# Patient Record
Sex: Male | Born: 1950 | Race: White | Hispanic: No | Marital: Married | Smoking: Former smoker
Health system: Southern US, Community
[De-identification: ages and names within clinical notes are randomized; demographics above are authoritative.]

## PROBLEM LIST (undated history)

## (undated) DIAGNOSIS — L709 Acne, unspecified: Secondary | ICD-10-CM

## (undated) DIAGNOSIS — C4491 Basal cell carcinoma of skin, unspecified: Secondary | ICD-10-CM

## (undated) HISTORY — DX: Acne, unspecified: L70.9

## (undated) HISTORY — PX: TONSILLECTOMY: SUR1361

## (undated) HISTORY — DX: Basal cell carcinoma of skin, unspecified: C44.91

---

## 1978-11-21 HISTORY — PX: VASECTOMY: SHX75

## 2010-07-22 ENCOUNTER — Ambulatory Visit: Payer: Self-pay | Admitting: General Practice

## 2011-07-21 IMAGING — CR LEFT WRIST - 2 VIEW
1 series · 2 of 2 positions shown · non-contrast
Comparison: none

REASON FOR EXAM: Eval for metallic foreign body (shrapnel) for MRI
clearance
COMMENTS:

PROCEDURE:     DXR - DXR WRIST LEFT AP AND LATERAL  - July 22, 2010  [DATE]
RESULT:     Two-view left wrist demonstrates no evidence of metallic foreign
bodies within the soft tissues which would preclude MRI evaluation. The
osseous structures are otherwise grossly unremarkable.

[Series 1: view not recorded · 0.17mm/px · 2 of 2 slices shown]
[im 1/2]
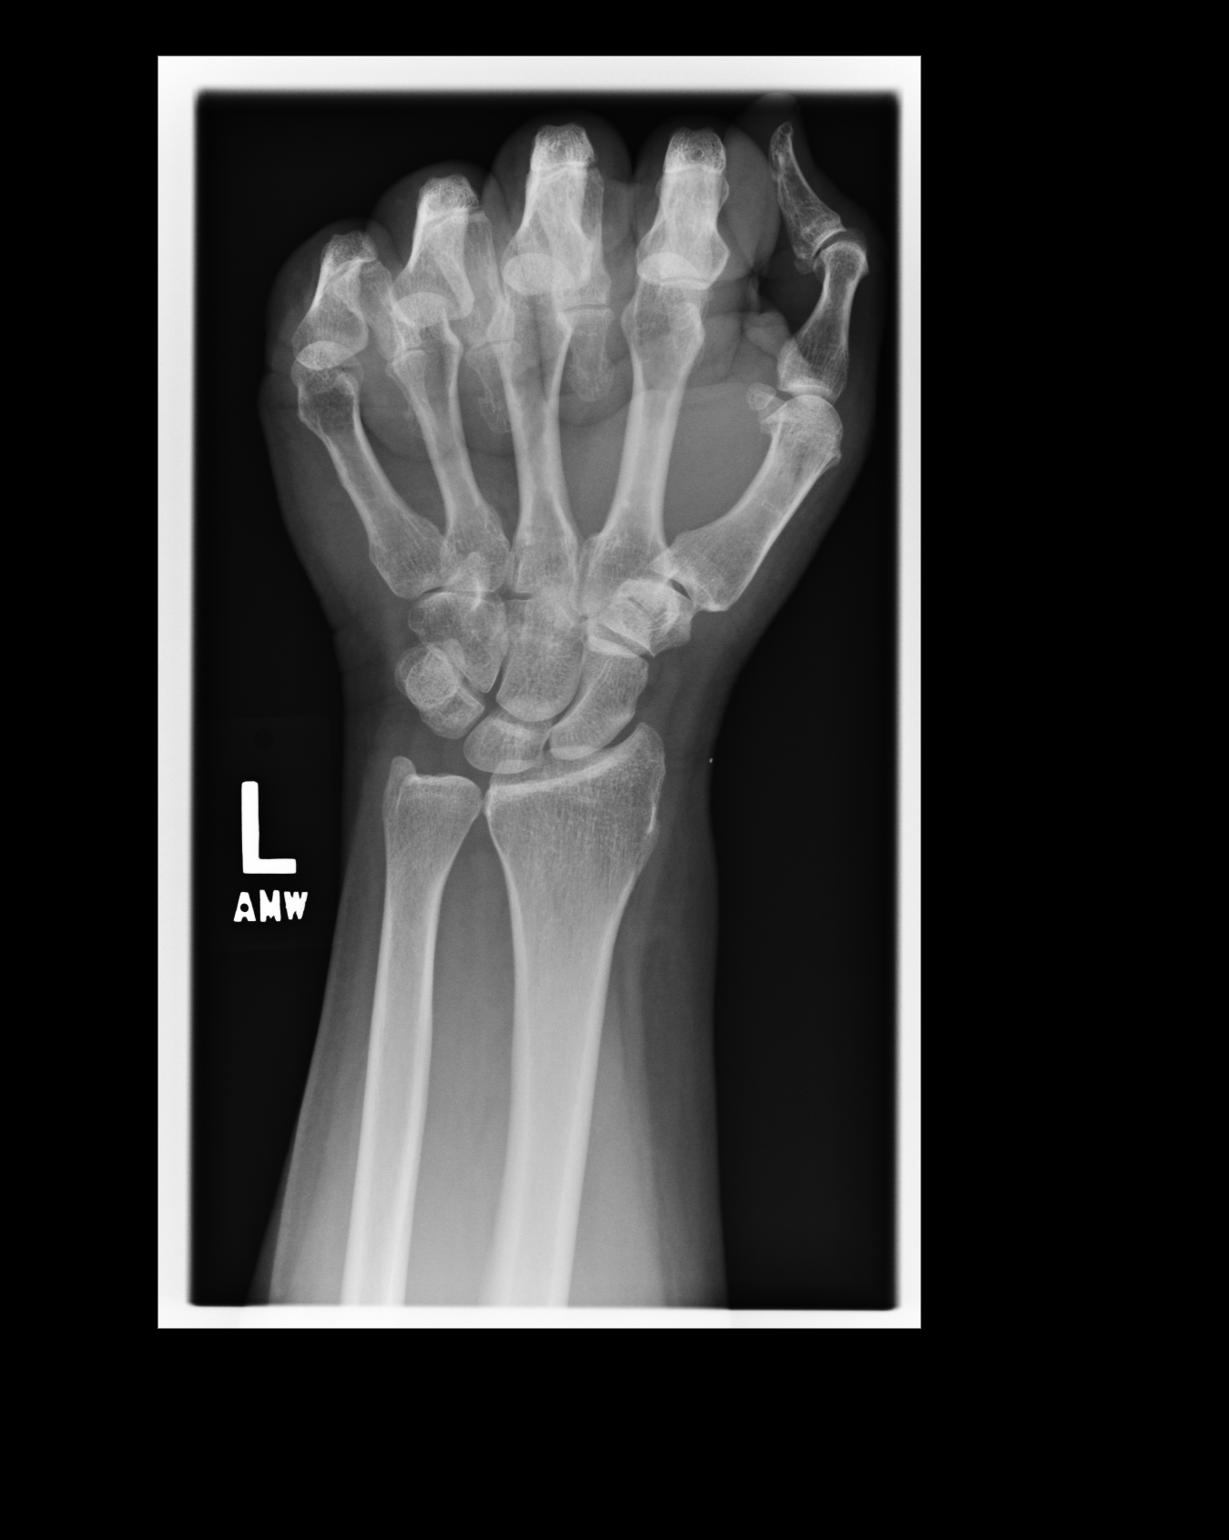
[im 2/2]
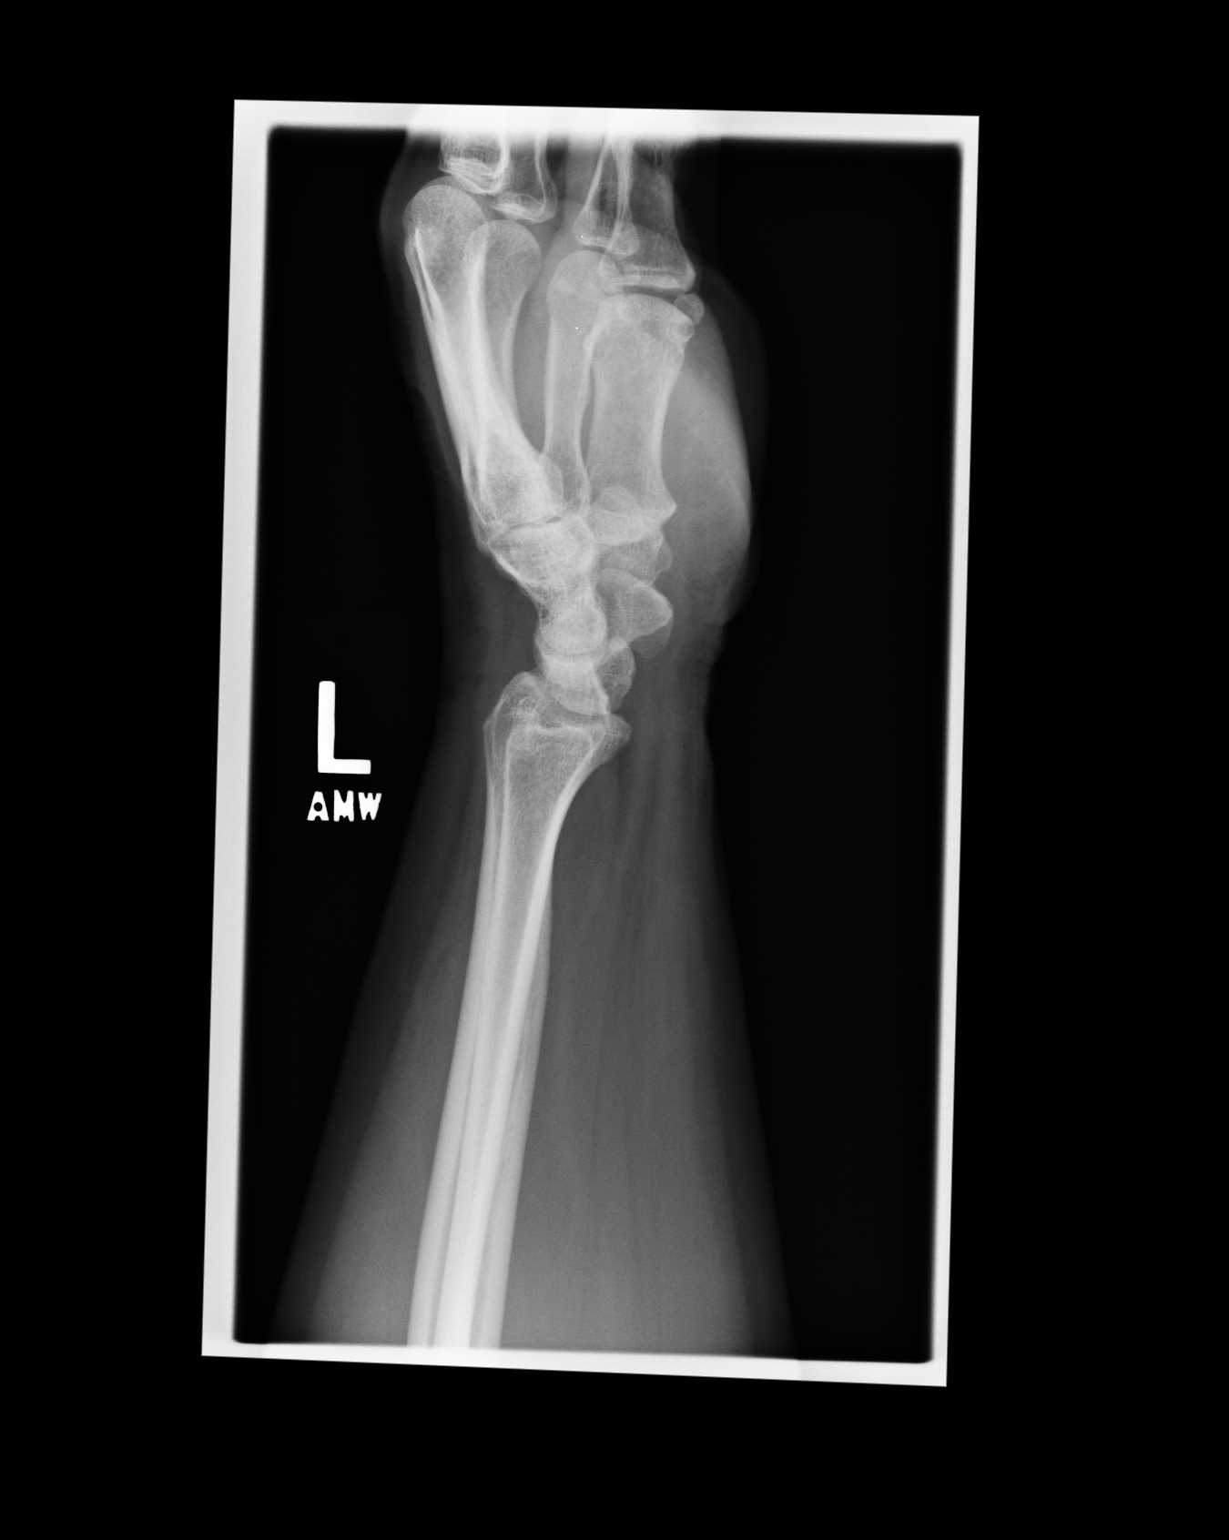

[2 of 2 positions shown; findings below may reference images not displayed]

IMPRESSION: No radiographic evidence which would preclude MRI evaluation.

## 2015-01-26 LAB — HEMOGLOBIN A1C: HEMOGLOBIN A1C: 6.3

## 2017-01-16 LAB — CBC AND DIFFERENTIAL
HCT: 41 (ref 41–53)
Hemoglobin: 14.2 (ref 13.5–17.5)
WBC: 6.8

## 2017-01-16 LAB — BASIC METABOLIC PANEL
Creatinine: 1 (ref <=1.3)
GLUCOSE: 116

## 2017-01-16 LAB — LIPID PANEL
Cholesterol: 225 — AB (ref 0–200)
HDL: 39 (ref 35–70)
LDL Cholesterol: 151
TRIGLYCERIDES: 174 — AB (ref 40–160)

## 2017-01-16 LAB — PSA: PSA: 0.8

## 2017-01-16 LAB — TSH: TSH: 2.78 (ref <=5.90)

## 2017-01-16 LAB — HEPATIC FUNCTION PANEL
ALT: 17 (ref 10–40)
AST: 13 — AB (ref 14–40)

## 2017-05-15 ENCOUNTER — Ambulatory Visit (INDEPENDENT_AMBULATORY_CARE_PROVIDER_SITE_OTHER): Payer: Self-pay | Admitting: Family Medicine

## 2017-05-15 ENCOUNTER — Encounter: Payer: Self-pay | Admitting: Family Medicine

## 2017-05-15 VITALS — BP 128/84 | HR 68 | Ht 70.0 in | Wt 220.8 lb

## 2017-05-15 DIAGNOSIS — R7309 Other abnormal glucose: Secondary | ICD-10-CM

## 2017-05-15 DIAGNOSIS — N529 Male erectile dysfunction, unspecified: Secondary | ICD-10-CM

## 2017-05-15 DIAGNOSIS — Z125 Encounter for screening for malignant neoplasm of prostate: Secondary | ICD-10-CM

## 2017-05-15 DIAGNOSIS — Z7689 Persons encountering health services in other specified circumstances: Secondary | ICD-10-CM

## 2017-05-15 DIAGNOSIS — E669 Obesity, unspecified: Secondary | ICD-10-CM

## 2017-05-15 DIAGNOSIS — E782 Mixed hyperlipidemia: Secondary | ICD-10-CM

## 2017-05-15 DIAGNOSIS — H9313 Tinnitus, bilateral: Secondary | ICD-10-CM

## 2017-05-15 DIAGNOSIS — E785 Hyperlipidemia, unspecified: Secondary | ICD-10-CM | POA: Insufficient documentation

## 2017-05-15 DIAGNOSIS — Z85828 Personal history of other malignant neoplasm of skin: Secondary | ICD-10-CM | POA: Insufficient documentation

## 2017-05-15 DIAGNOSIS — Z1211 Encounter for screening for malignant neoplasm of colon: Secondary | ICD-10-CM

## 2017-05-15 DIAGNOSIS — J3089 Other allergic rhinitis: Secondary | ICD-10-CM

## 2017-05-15 DIAGNOSIS — R030 Elevated blood-pressure reading, without diagnosis of hypertension: Secondary | ICD-10-CM

## 2017-05-15 DIAGNOSIS — H9319 Tinnitus, unspecified ear: Secondary | ICD-10-CM | POA: Insufficient documentation
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
## 2017-05-15 NOTE — Patient Instructions (Addendum)
Thank you for coming to the clinic today.  1. Keep up the good work.  2. Try to stay more active and main goal is drink less soda and more water. Ideally if only 1 soda a day or 1 soda about 2-3 x weekly, and water on regular basis this would be a big improvement. Goal is to limit progression diabetes, weight loss as well.  Recommend walking and swimming for regular exercise.  3. Recommend getting a BP cuff at home to check BP only once in a while few times a week or month is fine to keep check on it. Goal is < 140/90.  Once we review the lab results from your [place] annual physical 01/2017, we can determine what to do next.  Recommend baby aspirin 65m daily for preventing or reducing risk of heart attack and stroke.  Colon Cancer Screening: - For all adults age 65+routine colon cancer screening is highly recommended.     - Recent guidelines from [employer]commend starting age of 469- Early detection of colon cancer is important, because often there are no warning signs or symptoms, also if found early usually it can be cured. Late stage is hard to treat.  - If you are not interested in Colonoscopy screening (if done and normal you could be cleared for 5 to 10 years until next due), then Cologuard is an excellent alternative for screening test for Colon Cancer. It is highly sensitive for detecting DNA of colon cancer from even the earliest stages. Also, there is NO bowel prep required. - If Cologuard is NEGATIVE, then it is good for 3 years before next due - If Cologuard is POSITIVE, then it is strongly advised to get a Colonoscopy, which allows the GI doctor to locate the source of the cancer or polyp (even very early stage) and treat it by removing it. ------------------------- If you would like to proceed with Cologuard (stool DNA test) - FIRST, call your insurance company and tell them you want to check cost of Cologuard tell them CPT Code [phone](it may be  completely covered and you could get for no cost, OR max cost without any coverage is about $600). Also, keep in mind if you do NOT open the kit, and decide not to do the test, you will NOT be charged, you should contact the company if you decide not to do the test. - If you want to proceed, you can notify [place](phone message, [place] or at next visit) and we will order it for you. The test kit will be delivered to you house within about 1 week. Follow instructions to collect sample, you may call the company for any help or questions, 24/7 telephone support at [phone]  Please schedule a Follow-up Appointment to: Return in about 3 months (around 08/15/2017) for Welcome to Medicare Visit.  If you have any other questions or concerns, please feel free to call the clinic or send a message through [place] You may also schedule an earlier appointment if necessary.  Additionally, you may be receiving a survey about your experience at our clinic within a few days to 1 week by e-mail or mail. We value your feedback.  ANobie Putnam DO [place]

## 2017-05-16 DIAGNOSIS — Z1211 Encounter for screening for malignant neoplasm of colon: Secondary | ICD-10-CM | POA: Insufficient documentation

## 2017-05-16 DIAGNOSIS — R030 Elevated blood-pressure reading, without diagnosis of hypertension: Secondary | ICD-10-CM | POA: Insufficient documentation

## 2017-05-16 DIAGNOSIS — Z125 Encounter for screening for malignant neoplasm of prostate: Secondary | ICD-10-CM | POA: Insufficient documentation

## 2017-05-16 NOTE — Assessment & Plan Note (Signed)
Average risk patient, with prior reported negative screening - Last PSA 01/2017, prior values normal - Last DRE 01/2017 years ago reported normal - Clinically asymptomatic  Plan: 1. Check outside PSA result 2. Reviewed prostate cancer screening guidelines and risks including potential prostate biopsy if abnormal PSA, proceed with yearly PSA in future, and anticipate DRE as needed especially if abnormal PSA or new symptoms

## 2017-05-16 NOTE — Assessment & Plan Note (Signed)
Resolved s/p resection L nare No known recurrence Followed by [facility] Dermat[facility] Dr Kellie Moor Recommend to return for routine yearly skin CA screening

## 2017-05-16 NOTE — Assessment & Plan Note (Addendum)
Mildly elevated initial BP, repeat manual check improved - Home BP readings - none  No known complications   Plan:  1. Never on anti-HTN therapy - remain off now, no new dx 2. Encourage improved lifestyle - low sodium diet, reduce soda intake, inc water, regular exercise 3. Start monitor BP outside office occasionally, bring readings to next visit, if persistently >140/90 or new symptoms notify office sooner 4. Follow-up 3-6 months monitor BP

## 2017-05-16 NOTE — Assessment & Plan Note (Signed)
Due for routine colon cancer screening. Never had colonoscopy (not interested), no family history colon cancer. - Discussion today about recommendations for either Colonoscopy or Cologuard screening, benefits and risks of screening, interested in Cologuard, understands that if positive then recommendation is for diagnostic colonoscopy to follow-up.  - Patient advised to contact insurance first to learn cost, will notify us when ready for us to order Cologuard 

## 2017-05-16 NOTE — Assessment & Plan Note (Signed)
Stable without new concern Prior improved on sildenafil, uncertain dosage No refill requested today

## 2017-05-16 NOTE — Assessment & Plan Note (Signed)
Mild obesity BMI >32, no comparison weight Attributed to poor lifestyle, now starting to improve Follow-up outside labs for A1c screening Check future lipids

## 2017-05-16 NOTE — Assessment & Plan Note (Addendum)
Stable, may take OTC meds PRN Caution with recurrent sudafed use
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
## 2017-05-16 NOTE — Assessment & Plan Note (Signed)
Uncertain results but reportedly "mild elevated" in past, last 01/2017, awaiting results from outside PCP No results to calculated ASCVD risk Never on statin medicine  Plan: 1. Continue without statin 2. Counseling on starting ASA 81mg  for primary ASCVD risk reduction 3. Encourage improved lifestyle - low carb/cholesterol, reduce portion size, continue improving regular exercise 4. Follow-up 3-6 mo

## 2017-05-31 ENCOUNTER — Encounter: Payer: Self-pay | Admitting: Family Medicine

## 2017-05-31 DIAGNOSIS — R7303 Prediabetes: Secondary | ICD-10-CM | POA: Insufficient documentation

## 2017-08-23 ENCOUNTER — Other Ambulatory Visit: Payer: Self-pay | Admitting: Family Medicine

## 2017-08-23 ENCOUNTER — Encounter: Payer: Self-pay | Admitting: Family Medicine

## 2017-08-23 ENCOUNTER — Ambulatory Visit (INDEPENDENT_AMBULATORY_CARE_PROVIDER_SITE_OTHER): Payer: Medicare Other | Admitting: Family Medicine

## 2017-08-23 VITALS — BP 134/84 | HR 66 | Temp 98.5°F | Resp 16 | Wt 221.0 lb

## 2017-08-23 DIAGNOSIS — Z0000 Encounter for general adult medical examination without abnormal findings: Secondary | ICD-10-CM | POA: Diagnosis not present

## 2017-08-23 DIAGNOSIS — E782 Mixed hyperlipidemia: Secondary | ICD-10-CM

## 2017-08-23 DIAGNOSIS — Z125 Encounter for screening for malignant neoplasm of prostate: Secondary | ICD-10-CM

## 2017-08-23 DIAGNOSIS — R030 Elevated blood-pressure reading, without diagnosis of hypertension: Secondary | ICD-10-CM

## 2017-08-23 DIAGNOSIS — E669 Obesity, unspecified: Secondary | ICD-10-CM

## 2017-08-23 DIAGNOSIS — Z23 Encounter for immunization: Secondary | ICD-10-CM | POA: Diagnosis not present

## 2017-08-23 DIAGNOSIS — E66811 Obesity, class 1: Secondary | ICD-10-CM

## 2017-08-23 DIAGNOSIS — R7303 Prediabetes: Secondary | ICD-10-CM

## 2017-08-23 DIAGNOSIS — R7989 Other specified abnormal findings of blood chemistry: Secondary | ICD-10-CM

## 2017-08-23 NOTE — Patient Instructions (Addendum)
Thank you for coming to the clinic today.  1.  Recommend to start checking Blood Pressure outside office to know if it is elevated or not. Concern may have new diagnosis Hypertension or High Blood Pressure   EKG normal today, unchanged from 01/16/17  If your hearing is worsening or concerning, please consider further hearing testing and possible hearing aid. Check with medicare first for cost and coverage of these options.  If change mind let me know if want to order Pneumonia Vaccine - Prevnar-13 can return to office nurse visit. Or can order the Cologuard, we can order this when ready. Strongly recommended  DUE for FASTING BLOOD WORK (no food or drink after midnight before the lab appointment, only water or coffee without cream/sugar on the morning of)  SCHEDULE "Lab Only" visit in the morning at the clinic for lab draw in 6 MONTHS  - Make sure Lab Only appointment is at about 1 week before your next appointment, so that results will be available  For Lab Results, once available within 2-3 days of blood draw, you can can log in to MyChart online to view your results and a brief explanation. Also, we can discuss results at next follow-up visit.  Please schedule a Follow-up Appointment to: Return in about 6 months (around 02/21/2018) for Medicare Physical.   Please schedule Annual Medicare Wellness in October 2019 with Tiffany  If you have any other questions or concerns, please feel free to call the clinic or send a message through [place]. You may also schedule an earlier appointment if necessary.  Additionally, you may be receiving a survey about your experience at our clinic within a few days to 1 week by e-mail or mail. We value your feedback.  Nobie Putnam, DO [place]
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
## 2018-02-22 ENCOUNTER — Other Ambulatory Visit: Payer: Medicare Other

## 2018-02-22 DIAGNOSIS — Z125 Encounter for screening for malignant neoplasm of prostate: Secondary | ICD-10-CM | POA: Diagnosis not present

## 2018-02-22 DIAGNOSIS — R030 Elevated blood-pressure reading, without diagnosis of hypertension: Secondary | ICD-10-CM | POA: Diagnosis not present

## 2018-02-22 DIAGNOSIS — E782 Mixed hyperlipidemia: Secondary | ICD-10-CM

## 2018-02-22 DIAGNOSIS — E669 Obesity, unspecified: Secondary | ICD-10-CM

## 2018-02-22 DIAGNOSIS — E66811 Obesity, class 1: Secondary | ICD-10-CM

## 2018-02-22 DIAGNOSIS — R7989 Other specified abnormal findings of blood chemistry: Secondary | ICD-10-CM | POA: Diagnosis not present

## 2018-02-22 DIAGNOSIS — R7303 Prediabetes: Secondary | ICD-10-CM | POA: Diagnosis not present

## 2018-02-23 LAB — COMPLETE METABOLIC PANEL WITH GFR
AG RATIO: 1.4 (calc) (ref 1.0–2.5)
ALBUMIN MSPROF: 3.8 g/dL (ref 3.6–5.1)
ALKALINE PHOSPHATASE (APISO): 84 U/L (ref 40–115)
ALT: 12 U/L (ref 9–46)
AST: 13 U/L (ref 10–35)
BILIRUBIN TOTAL: 0.5 mg/dL (ref 0.2–1.2)
BUN: 15 mg/dL (ref 7–25)
CHLORIDE: 104 mmol/L (ref 98–110)
CO2: 28 mmol/L (ref 20–32)
Calcium: 8.9 mg/dL (ref 8.6–10.3)
Creat: 1.12 mg/dL (ref 0.70–1.25)
GFR, Est African American: 79 mL/min/{1.73_m2} (ref >=60)
GFR, Est Non African American: 69 mL/min/{1.73_m2} (ref >=60)
GLOBULIN: 2.7 g/dL (ref 1.9–3.7)
Glucose, Bld: 111 mg/dL — ABNORMAL HIGH (ref 65–99)
Potassium: 4.2 mmol/L (ref 3.5–5.3)
SODIUM: 139 mmol/L (ref 135–146)
Total Protein: 6.5 g/dL (ref 6.1–8.1)

## 2018-02-23 LAB — CBC WITH DIFFERENTIAL/PLATELET
BASOS ABS: 42 {cells}/uL (ref 0–200)
Basophils Relative: 0.7 %
EOS ABS: 108 {cells}/uL (ref 15–500)
Eosinophils Relative: 1.8 %
HEMATOCRIT: 40.9 % (ref 38.5–50.0)
HEMOGLOBIN: 14 g/dL (ref 13.2–17.1)
LYMPHS ABS: 1764 {cells}/uL (ref 850–3900)
MCH: 29 pg (ref 27.0–33.0)
MCHC: 34.2 g/dL (ref 32.0–36.0)
MCV: 84.9 fL (ref 80.0–100.0)
MONOS PCT: 9.9 %
MPV: 10.6 fL (ref 7.5–12.5)
NEUTROS ABS: 3492 {cells}/uL (ref 1500–7800)
Neutrophils Relative %: 58.2 %
Platelets: 223 10*3/uL (ref 140–400)
RBC: 4.82 10*6/uL (ref 4.20–5.80)
RDW: 13.4 % (ref 11.0–15.0)
Total Lymphocyte: 29.4 %
WBC mixed population: 594 cells/uL (ref 200–950)
WBC: 6 10*3/uL (ref 3.8–10.8)

## 2018-02-23 LAB — LIPID PANEL
CHOL/HDL RATIO: 5.1 (calc) — AB (ref <5.0)
CHOLESTEROL: 220 mg/dL — AB (ref <200)
HDL: 43 mg/dL (ref >40)
LDL Cholesterol (Calc): 149 mg/dL (calc) — ABNORMAL HIGH
Non-HDL Cholesterol (Calc): 177 mg/dL (calc) — ABNORMAL HIGH (ref <130)
Triglycerides: 150 mg/dL — ABNORMAL HIGH (ref <150)

## 2018-02-23 LAB — HEMOGLOBIN A1C
EAG (MMOL/L): 7.3 (calc)
Hgb A1c MFr Bld: 6.2 % of total Hgb — ABNORMAL HIGH (ref <5.7)
MEAN PLASMA GLUCOSE: 131 (calc)

## 2018-02-23 LAB — PSA, TOTAL WITH REFLEX TO PSA, FREE: PSA, Total: 0.7 ng/mL (ref <=4.0)

## 2018-02-28 ENCOUNTER — Ambulatory Visit (INDEPENDENT_AMBULATORY_CARE_PROVIDER_SITE_OTHER): Payer: Medicare Other | Admitting: Family Medicine

## 2018-02-28 ENCOUNTER — Encounter: Payer: Self-pay | Admitting: Family Medicine

## 2018-02-28 VITALS — BP 135/81 | HR 67 | Temp 98.0°F | Resp 16 | Ht 70.0 in | Wt 216.0 lb

## 2018-02-28 DIAGNOSIS — R7303 Prediabetes: Secondary | ICD-10-CM

## 2018-02-28 DIAGNOSIS — E669 Obesity, unspecified: Secondary | ICD-10-CM | POA: Diagnosis not present

## 2018-02-28 DIAGNOSIS — E782 Mixed hyperlipidemia: Secondary | ICD-10-CM | POA: Diagnosis not present

## 2018-02-28 DIAGNOSIS — R030 Elevated blood-pressure reading, without diagnosis of hypertension: Secondary | ICD-10-CM

## 2018-02-28 NOTE — Assessment & Plan Note (Addendum)
Stable, controlled Pre-DM with A1c 6.2 from prior 6.3 Concern with obesity, HLD  Plan:  1. Not on any therapy currently  2. Encourage improved lifestyle - low carb, low sugar diet, reduce portion size, continue improving regular exercise 3. Follow-up yearly A1c - offered 6 month check, not interested
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
## 2018-02-28 NOTE — Assessment & Plan Note (Signed)
Weight down 5 lbs in 6 months Encourage to continue improving diet / exercise, less soda more water Goal for wt loss improve cholesterol and sugar

## 2018-02-28 NOTE — Assessment & Plan Note (Addendum)
Appropriate range BP on initial reading - Home BP readings - none  No known complications    Plan:  1. Never on anti-HTN therapy - remain off now, no new dx 2. Encourage improved lifestyle - low sodium diet, reduce soda intake, inc water, regular exercise 3. Again advised him to start monitor BP outside office occasionally, bring readings to next visit, if persistently >140/90 or new symptoms notify office sooner

## 2018-02-28 NOTE — Assessment & Plan Note (Signed)
Mixed results cholesterol, elevated LDL, some poor lifestyle Last lipid panel 02/2018 Calculated ASCVD 10 yr risk score 15.1%  Plan: 1. Discussed ASCVD risk - offered Statin, he has declined - Defer ASA as well, this would be other recommendation for him - Encourage improved lifestyle - low carb/cholesterol, reduce portion size, continue improving regular exercise - Yearly lipid

## 2018-02-28 NOTE — Patient Instructions (Addendum)
Thank you for coming to the office today.  A1c 6.2, similar to previous, slightly lower from 6.3  Cholesterol is similar to before, still elevated LDL bad cholesterol  Keep up the good work on lifestyle and diet to improve sugar, cholesterol, and weight  Colon Cancer Screening: - For all adults age 66+ routine colon cancer screening is highly recommended.     - Recent guidelines from [place] recommend starting age of 45 - Early detection of colon cancer is important, because often there are no warning signs or symptoms, also if found early usually it can be cured. Late stage is hard to treat.  - If you are not interested in Colonoscopy screening (if done and normal you could be cleared for 5 to 10 years until next due), then Cologuard is an excellent alternative for screening test for Colon Cancer. It is highly sensitive for detecting DNA of colon cancer from even the earliest stages. Also, there is NO bowel prep required. - If Cologuard is NEGATIVE, then it is good for 3 years before next due - If Cologuard is POSITIVE, then it is strongly advised to get a Colonoscopy, which allows the GI doctor to locate the source of the cancer or polyp (even very early stage) and treat it by removing it. ------------------------- If you would like to proceed with Cologuard (stool DNA test) - FIRST, call your insurance company and tell them you want to check cost of Cologuard tell them CPT Code [phone] (it may be completely covered and you could get for no cost, OR max cost without any coverage is about $600). Also, keep in mind if you do NOT open the kit, and decide not to do the test, you will NOT be charged, you should contact the company if you decide not to do the test. - If you want to proceed, you can notify us (phone message, [place], or at next visit) and we will order it for you. The test kit will be delivered to you house within about 1 week. Follow instructions to collect  sample, you may call the company for any help or questions, 24/7 telephone support at [phone].    DUE for FASTING BLOOD WORK (no food or drink after midnight before the lab appointment, only water or coffee without cream/sugar on the morning of)  SCHEDULE "Lab Only" visit in the morning at the clinic for lab draw in 1 YEAR  - Make sure Lab Only appointment is at about 1 week before your next appointment, so that results will be available  For Lab Results, once available within 2-3 days of blood draw, you can can log in to MyChart online to view your results and a brief explanation. Also, we can discuss results at next follow-up visit.  Please schedule a Follow-up Appointment to: Return in about 1 year (around 03/01/2019) for Annual Physical.  If you have any other questions or concerns, please feel free to call the office or send a message through [place]. You may also schedule an earlier appointment if necessary.  Additionally, you may be receiving a survey about your experience at our office within a few days to 1 week by e-mail or mail. We value your feedback.  Nobie Putnam, DO [facility], [facility]   Fat and Cholesterol Restricted Diet Getting too much fat and cholesterol in your diet may cause health problems. Following this diet helps keep your fat and cholesterol at normal levels. This can keep you from getting  sick. What types of fat should I choose?  Choose monosaturated and polyunsaturated fats. These are found in foods such as olive oil, canola oil, flaxseeds, walnuts, almonds, and seeds.  Eat more omega-3 fats. Good choices include salmon, mackerel, sardines, tuna, flaxseed oil, and ground flaxseeds.  Limit saturated fats. These are in animal products such as meats, butter, and cream. They can also be in plant products such as palm oil, palm kernel oil, and coconut oil.  Avoid foods with partially hydrogenated oils in them. These contain trans  fats. Examples of foods that have trans fats are stick margarine, some tub margarines, cookies, crackers, and other baked goods. What general guidelines do I need to follow?  Check food labels. Look for the words "trans fat" and "saturated fat."  When preparing a meal: ? Fill half of your plate with vegetables and green salads. ? Fill one fourth of your plate with whole grains. Look for the word "whole" as the first word in the ingredient list. ? Fill one fourth of your plate with lean protein foods.  Eat more foods that have fiber, like apples, carrots, beans, peas, and barley.  Eat more home-cooked foods. Eat less at restaurants and buffets.  Limit or avoid alcohol.  Limit foods high in starch and sugar.  Limit fried foods.  Cook foods without frying them. Baking, boiling, grilling, and broiling are all great options.  Lose weight if you are overweight. Losing even a small amount of weight can help your overall health. It can also help prevent diseases such as diabetes and heart disease. What foods can I eat? Grains Whole grains, such as whole wheat or whole grain breads, crackers, cereals, and pasta. Unsweetened oatmeal, bulgur, barley, quinoa, or brown rice. Corn or whole wheat flour tortillas. Vegetables Fresh or frozen vegetables (raw, steamed, roasted, or grilled). Green salads. Fruits All fresh, canned (in natural juice), or frozen fruits. Meat and Other Protein Products Ground beef (85% or leaner), grass-fed beef, or beef trimmed of fat. Skinless chicken or [place]. Ground chicken or [place]. Pork trimmed of fat. All fish and seafood. Eggs. Dried beans, peas, or lentils. Unsalted nuts or seeds. Unsalted canned or dry beans. Dairy Low-fat dairy products, such as skim or 1% milk, 2% or reduced-fat cheeses, low-fat ricotta or cottage cheese, or plain low-fat yogurt. Fats and Oils Tub margarines without trans fats. Light or reduced-fat mayonnaise and salad dressings. Avocado.  Olive, canola, sesame, or safflower oils. Natural peanut or almond butter (choose ones without added sugar and oil). The items listed above may not be a complete list of recommended foods or beverages. Contact your dietitian for more options. What foods are not recommended? Grains White bread. White pasta. White rice. Cornbread. Bagels, pastries, and croissants. Crackers that contain trans fat. Vegetables White potatoes. Corn. Creamed or fried vegetables. Vegetables in a cheese sauce. Fruits Dried fruits. Canned fruit in light or heavy syrup. Fruit juice. Meat and Other Protein Products Fatty cuts of meat. Ribs, chicken wings, bacon, sausage, bologna, salami, chitterlings, fatback, hot dogs, bratwurst, and packaged luncheon meats. Liver and organ meats. Dairy Whole or 2% milk, cream, half-and-half, and cream cheese. Whole milk cheeses. Whole-fat or sweetened yogurt. Full-fat cheeses. Nondairy creamers and whipped toppings. Processed cheese, cheese spreads, or cheese curds. Sweets and Desserts Corn syrup, sugars, honey, and molasses. Candy. Jam and jelly. Syrup. Sweetened cereals. Cookies, pies, cakes, donuts, muffins, and ice cream. Fats and Oils Butter, stick margarine, lard, shortening, ghee, or bacon fat. Coconut, palm kernel,  or palm oils. Beverages Alcohol. Sweetened drinks (such as sodas, lemonade, and fruit drinks or punches). The items listed above may not be a complete list of foods and beverages to avoid. Contact your dietitian for more information. This information is not intended to replace advice given to you by your health care provider. Make sure you discuss any questions you have with your health care provider. Document Released: 05/08/2012 Document Revised: 07/14/2016 Document Reviewed: 02/06/2014 Elsevier Interactive Patient Education  [employer].

## 2018-08-07 DIAGNOSIS — J019 Acute sinusitis, unspecified: Secondary | ICD-10-CM | POA: Diagnosis not present

## 2018-09-04 ENCOUNTER — Ambulatory Visit (INDEPENDENT_AMBULATORY_CARE_PROVIDER_SITE_OTHER): Payer: Medicare Other

## 2018-09-04 VITALS — BP 154/82 | HR 72 | Temp 98.6°F | Resp 16 | Ht 70.0 in | Wt 206.4 lb

## 2018-09-04 DIAGNOSIS — Z23 Encounter for immunization: Secondary | ICD-10-CM | POA: Diagnosis not present

## 2018-09-04 DIAGNOSIS — Z0000 Encounter for general adult medical examination without abnormal findings: Secondary | ICD-10-CM

## 2018-09-04 NOTE — Patient Instructions (Signed)
Ricky Graves , Thank you for taking time to come for your Medicare Wellness Visit. I appreciate your ongoing commitment to your health goals. Please review the following plan we discussed and let me know if I can assist you in the future.   Screening recommendations/referrals: Colonoscopy:declined  Recommended yearly ophthalmology/optometry visit for glaucoma screening and checkup Recommended yearly dental visit for hygiene and checkup  Vaccinations: Influenza vaccine: declined today  Pneumococcal vaccine: prevnar 13 done today, pneumovax 23 due 09/06/2019 Tdap vaccine: completed 01/29/2017 Shingles vaccine: shingrix eligible, check with your insurance company for coverage    Advanced directives: Advance directive discussed with you today. Even though you declined this today please call our office should you change your mind and we can give you the proper paperwork for you to fill out.  Conditions/risks identified: Recommend drinking at least 6-8 glasses of water a day   Next appointment: Follow up in one year for your annual wellness exam.   Preventive Care 65 Years and Older, Male Preventive care refers to lifestyle choices and visits with your health care provider that can promote health and wellness. What does preventive care include?  A yearly physical exam. This is also called an annual well check.  Dental exams once or twice a year.  Routine eye exams. Ask your health care provider how often you should have your eyes checked.  Personal lifestyle choices, including:  Daily care of your teeth and gums.  Regular physical activity.  Eating a healthy diet.  Avoiding tobacco and drug use.  Limiting alcohol use.  Practicing safe sex.  Taking low doses of aspirin every day.  Taking vitamin and mineral supplements as recommended by your health care provider. What happens during an annual well check? The services and screenings done by your health care provider during your  annual well check will depend on your age, overall health, lifestyle risk factors, and family history of disease. Counseling  Your health care provider may ask you questions about your:  Alcohol use.  Tobacco use.  Drug use.  Emotional well-being.  Home and relationship well-being.  Sexual activity.  Eating habits.  History of falls.  Memory and ability to understand (cognition).  Work and work Statistician. Screening  You may have the following tests or measurements:  Height, weight, and BMI.  Blood pressure.  Lipid and cholesterol levels. These may be checked every 5 years, or more frequently if you are over 28 years old.  Skin check.  Lung cancer screening. You may have this screening every year starting at age 76 if you have a 30-pack-year history of smoking and currently smoke or have quit within the past 15 years.  Fecal occult blood test (FOBT) of the stool. You may have this test every year starting at age 32.  Flexible sigmoidoscopy or colonoscopy. You may have a sigmoidoscopy every 5 years or a colonoscopy every 10 years starting at age 54.  Prostate cancer screening. Recommendations will vary depending on your family history and other risks.  Hepatitis C blood test.  Hepatitis B blood test.  Sexually transmitted disease (STD) testing.  Diabetes screening. This is done by checking your blood sugar (glucose) after you have not eaten for a while (fasting). You may have this done every 1-3 years.  Abdominal aortic aneurysm (AAA) screening. You may need this if you are a current or former smoker.  Osteoporosis. You may be screened starting at age 108 if you are at high risk. Talk with your health care  provider about your test results, treatment options, and if necessary, the need for more tests. Vaccines  Your health care provider may recommend certain vaccines, such as:  Influenza vaccine. This is recommended every year.  Tetanus, diphtheria, and  acellular pertussis (Tdap, Td) vaccine. You may need a Td booster every 10 years.  Zoster vaccine. You may need this after age 48.  Pneumococcal 13-valent conjugate (PCV13) vaccine. One dose is recommended after age 25.  Pneumococcal polysaccharide (PPSV23) vaccine. One dose is recommended after age 28. Talk to your health care provider about which screenings and vaccines you need and how often you need them. This information is not intended to replace advice given to you by your health care provider. Make sure you discuss any questions you have with your health care provider. Document Released: 12/04/2015 Document Revised: 07/27/2016 Document Reviewed: 09/08/2015 Elsevier Interactive Patient Education  2017 [place] Prevention in the Home Falls can cause injuries. They can happen to people of all ages. There are many things you can do to make your home safe and to help prevent falls. What can I do on the outside of my home?  Regularly fix the edges of walkways and driveways and fix any cracks.  Remove anything that might make you trip as you walk through a door, such as a raised step or threshold.  Trim any bushes or trees on the path to your home.  Use bright outdoor lighting.  Clear any walking paths of anything that might make someone trip, such as rocks or tools.  Regularly check to see if handrails are loose or broken. Make sure that both sides of any steps have handrails.  Any raised decks and porches should have guardrails on the edges.  Have any leaves, snow, or ice cleared regularly.  Use sand or salt on walking paths during winter.  Clean up any spills in your garage right away. This includes oil or grease spills. What can I do in the bathroom?  Use night lights.  Install grab bars by the toilet and in the tub and shower. Do not use towel bars as grab bars.  Use non-skid mats or decals in the tub or shower.  If you need to sit down in the shower, use  a plastic, non-slip stool.  Keep the floor dry. Clean up any water that spills on the floor as soon as it happens.  Remove soap buildup in the tub or shower regularly.  Attach bath mats securely with double-sided non-slip rug tape.  Do not have throw rugs and other things on the floor that can make you trip. What can I do in the bedroom?  Use night lights.  Make sure that you have a light by your bed that is easy to reach.  Do not use any sheets or blankets that are too big for your bed. They should not hang down onto the floor.  Have a firm chair that has side arms. You can use this for support while you get dressed.  Do not have throw rugs and other things on the floor that can make you trip. What can I do in the kitchen?  Clean up any spills right away.  Avoid walking on wet floors.  Keep items that you use a lot in easy-to-reach places.  If you need to reach something above you, use a strong step stool that has a grab bar.  Keep electrical cords out of the way.  Do not use floor polish  or wax that makes floors slippery. If you must use wax, use non-skid floor wax.  Do not have throw rugs and other things on the floor that can make you trip. What can I do with my stairs?  Do not leave any items on the stairs.  Make sure that there are handrails on both sides of the stairs and use them. Fix handrails that are broken or loose. Make sure that handrails are as long as the stairways.  Check any carpeting to make sure that it is firmly attached to the stairs. Fix any carpet that is loose or worn.  Avoid having throw rugs at the top or bottom of the stairs. If you do have throw rugs, attach them to the floor with carpet tape.  Make sure that you have a light switch at the top of the stairs and the bottom of the stairs. If you do not have them, ask someone to add them for you. What else can I do to help prevent falls?  Wear shoes that:  Do not have high heels.  Have  rubber bottoms.  Are comfortable and fit you well.  Are closed at the toe. Do not wear sandals.  If you use a stepladder:  Make sure that it is fully opened. Do not climb a closed stepladder.  Make sure that both sides of the stepladder are locked into place.  Ask someone to hold it for you, if possible.  Clearly mark and make sure that you can see:  Any grab bars or handrails.  First and last steps.  Where the edge of each step is.  Use tools that help you move around (mobility aids) if they are needed. These include:  Canes.  Walkers.  Scooters.  Crutches.  Turn on the lights when you go into a dark area. Replace any light bulbs as soon as they burn out.  Set up your furniture so you have a clear path. Avoid moving your furniture around.  If any of your floors are uneven, fix them.  If there are any pets around you, be aware of where they are.  Review your medicines with your doctor. Some medicines can make you feel dizzy. This can increase your chance of falling. Ask your doctor what other things that you can do to help prevent falls. This information is not intended to replace advice given to you by your health care provider. Make sure you discuss any questions you have with your health care provider. Document Released: 09/03/2009 Document Revised: 04/14/2016 Document Reviewed: 12/12/2014 Elsevier Interactive Patient Education  2017 [employer].

## 2018-09-04 NOTE — Progress Notes (Signed)
Subjective:   Ricky Graves is a 67 y.o. male who presents for an Initial Medicare Annual Wellness Visit.  Review of Systems  Cardiac Risk Factors include: advanced age (>9men, >78 women);male gender;smoking/ tobacco exposure    Objective:    Today's Vitals   09/04/18 1006  BP: (!) 154/82  Pulse: 72  Resp: 16  Temp: 98.6 F (37 C)  TempSrc: Oral  Weight: 206 lb 6.4 oz (93.6 kg)  Height: 5\' 10"  (1.778 m)   Body mass index is 29.62 kg/m.  Advanced Directives 09/04/2018 08/23/2017  Does Patient Have a Medical Advance Directive? No No  Does patient want to make changes to medical advance directive? - Yes (MAU/Ambulatory/Procedural Areas - Information given)  Would patient like information on creating a medical advance directive? No - Patient declined -    Current Medications (verified) Outpatient Encounter Medications as of 09/04/2018  Medication Sig  . acetaminophen (TYLENOL) 325 MG tablet Take 650 mg by mouth every 6 (six) hours as needed.   No facility-administered encounter medications on file as of 09/04/2018.     Allergies (verified) Benadryl [diphenhydramine]   History: Past Medical History:  Diagnosis Date  . Adult acne   . Cancer of the skin, basal cell    Past Surgical History:  Procedure Laterality Date  . TONSILLECTOMY    . VASECTOMY  1980   Family History  Problem Relation Age of Onset  . Heart disease Father   . Stroke Father   . Diabetes Father   . Diabetes Paternal Grandmother    Social History   Socioeconomic History  . Marital status: Married    Spouse name: Not on file  . Number of children: Not on file  . Years of education: Vocational  . Highest education level: Some college, no degree  Occupational History  . 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 Financial resource strain: Not hard at all  . Food insecurity:    Worry: Never true    Inability: Never true  .  Transportation needs:    Medical: No    Non-medical: No  Tobacco Use  . Smoking status: Former Smoker    Packs/day: 1.50    Years: 30.00    Pack years: 45.00    Types: Cigarettes    Last attempt to quit: 05/15/2008    Years since quitting: 10.3  . Smokeless tobacco: Never Used  Substance and Sexual Activity  . Alcohol use: Yes  . Drug use: No  . Sexual activity: Not on file  Lifestyle  . Physical activity:    Days per week: 0 days    Minutes per session: 0 min  . Stress: Not at all  Relationships  . Social connections:    Talks on phone: More than three times a week    Gets together: More than three times a week    Attends religious service: Never    Active member of club or organization: No    Attends meetings of clubs or organizations: Never    Relationship status: Married  Other Topics Concern  . Not on file  Social History Narrative  . Not on file   Tobacco Counseling Counseling given: Not Answered   Clinical Intake:  Pre-visit preparation completed: Yes  Pain : No/denies pain     Nutritional Status: BMI 25 -29 Overweight Nutritional Risks: None Diabetes: No  How often do you need to have someone help you when  you read instructions, pamphlets, or other written materials from your doctor or pharmacy?: 1 - Never What is the last grade level you completed in school?: some college   Interpreter Needed?: No  Information entered by :: Tiffany Hill,LPN   Activities of Daily Living In your present state of health, do you have any difficulty performing the following activities: 09/04/2018  Hearing? Y  Comment declines hearing aids   Vision? N  Comment declines glasses  Difficulty concentrating or making decisions? N  Walking or climbing stairs? N  Dressing or bathing? N  Doing errands, shopping? N  Preparing Food and eating ? N  Using the Toilet? N  In the past six months, have you accidently leaked urine? N  Do you have problems with loss of bowel  control? N  Managing your Medications? N  Managing your Finances? N  Housekeeping or managing your Housekeeping? N  Some recent data might be hidden     Immunizations and Health Maintenance Immunization History  Administered Date(s) Administered  . Influenza, High Dose Seasonal PF 08/23/2017  . Pneumococcal Conjugate-13 09/04/2018   Health Maintenance Due  Topic Date Due  . Hepatitis C Screening  1951/09/08  . COLONOSCOPY  05/14/2002  . INFLUENZA VACCINE  06/21/2018    Patient Care Team: Olin Hauser, DO as PCP - General (Family Medicine)  Indicate any recent Medical Services you may have received from other than [facility] providers in the past year (date may be approximate).    Assessment:   This is a routine wellness examination for [facility].  Hearing/Vision screen Vision Screening Comments: Goes [facility]   Dietary issues and exercise activities discussed: Current Exercise Habits: The patient has a physically strenous job, but has no regular exercise apart from work., Exercise limited by: None identified  Goals    . DIET - INCREASE WATER INTAKE     Recommend drinking at least 6-8 glasses of water a day     . Weight (lb) < 200 lb (90.7 kg)     Wants to be at 165 lbs quit smoking made weight gain 221 lbs      Depression Screen PHQ 2/9 Scores 09/04/2018 02/28/2018 08/23/2017 05/15/2017  PHQ - 2 Score 0 0 0 0  PHQ- 9 Score - - - 0    Fall Risk Fall Risk  09/04/2018 02/28/2018 08/23/2017 05/15/2017  Falls in the past year? No No No No  Risk for fall due to : - - - History of fall(s)    FALL RISK PREVENTION PERTAINING TO THE HOME:  Any stairs in or around the home WITH handrails? Yes  Home free of loose throw rugs in walkways, pet beds, electrical cords, etc? Yes  Adequate lighting in your home to reduce risk of falls? Yes   ASSISTIVE DEVICES UTILIZED TO PREVENT FALLS:  Life alert? No  Use of a cane, walker or w/c? No  Grab bars in the  bathroom? No  Shower chair or bench in shower? No  Elevated toilet seat or a handicapped toilet? No   DME ORDERS:  DME order needed?  No   TIMED UP AND GO:  Was the test performed? Yes .  Length of time to ambulate 10 feet: 8 sec.   GAIT:  Appearance of gait: Gait stead-fast without the use of an assistive device. Education: Fall risk prevention has been discussed.  Intervention(s) required? No    Cognitive Function: MMSE - Mini Mental State Exam 08/23/2017  Orientation to  time 5  Orientation to Place 5  Registration 3  Attention/ Calculation 5  Recall 3  Language- name 2 objects 2  Language- repeat 1  Language- follow 3 step command 3  Language- read & follow direction 1  Write a sentence 1  Copy design 1  Total score 30     6CIT Screen 09/04/2018  What Year? 0 points  What month? 0 points  What time? 0 points  Count back from 20 0 points  Months in reverse 0 points  Repeat phrase 0 points  Total Score 0    Screening Tests Health Maintenance  Topic Date Due  . Hepatitis C Screening  1951-07-28  . COLONOSCOPY  05/14/2002  . INFLUENZA VACCINE  06/21/2018  . PNA vac Low Risk Adult (1 of 2 - PCV13) 03/01/2019 (Originally 05/14/2017)  . TETANUS/TDAP  01/20/2027    Qualifies for Shingles Vaccine? Yes   Due for Shingrix. Education has been provided regarding the importance of this vaccine. Pt has been advised to call insurance company to determine out of pocket expense. Advised may also receive vaccine at local pharmacy or Health Dept. Verbalized acceptance and understanding.  Tdap: completed 01/29/2017  Flu Vaccine: Due for Flu vaccine. Does the patient want to receive this vaccine today?  No . Education has been provided regarding the importance of this vaccine but still declined. Advised may receive this vaccine at local pharmacy or Health Dept. Aware to provide a copy of the vaccination record if obtained from local pharmacy or Health Dept. Verbalized acceptance  and understanding.  Pneumococcal Vaccine: prevnar 13 done today, discussed need for pneumovax 23 in one year (09/05/2019)  Cancer Screenings:  Colorectal Screening:declined, discussed cologuard and colonoscopy options   Lung Cancer Screening: (Low Dose CT Chest recommended if Age 11-80 years, 30 pack-year currently smoking OR have quit w/in 15years.) does qualify.  Declined    Additional Screening:  Hepatitis C Screening: does qualify; declined  Vision Screening: Recommended annual ophthalmology exams for early detection of glaucoma and other disorders of the eye. Is the patient up to date with their annual eye exam?  Yes  Who is the provider or what is the name of the office in which the pt attends annual eye exams? [place] Screening: Recommended annual dental exams for proper oral hygiene  Community Resource Referral:  CRR required this visit?  No       Plan:    I have personally reviewed and addressed the Medicare Annual Wellness questionnaire and have noted the following in the patient's chart:  A. Medical and social history B. Use of alcohol, tobacco or illicit drugs  C. Current medications and supplements D. Functional ability and status E.  Nutritional status F.  Physical activity G. Advance directives H. List of other physicians I.  Hospitalizations, surgeries, and ER visits in previous 12 months J.  [place] such as hearing and vision if needed, cognitive and depression L. Referrals and appointments   In addition, I have reviewed and discussed with patient certain preventive protocols, quality metrics, and best practice recommendations. A written personalized care plan for preventive services as well as general preventive health recommendations were provided to patient.   Signed,  Tyler Aas, LPN Nurse Health Advisor   Nurse Notes:none

## 2018-09-26 DIAGNOSIS — Z23 Encounter for immunization: Secondary | ICD-10-CM | POA: Diagnosis not present

## 2019-02-25 ENCOUNTER — Other Ambulatory Visit: Payer: Medicare Other

## 2019-02-25 ENCOUNTER — Other Ambulatory Visit: Payer: Self-pay | Admitting: Family Medicine

## 2019-02-25 ENCOUNTER — Other Ambulatory Visit: Payer: Self-pay

## 2019-02-25 DIAGNOSIS — R7303 Prediabetes: Secondary | ICD-10-CM | POA: Diagnosis not present

## 2019-02-25 DIAGNOSIS — E782 Mixed hyperlipidemia: Secondary | ICD-10-CM | POA: Diagnosis not present

## 2019-02-25 DIAGNOSIS — R351 Nocturia: Secondary | ICD-10-CM | POA: Diagnosis not present

## 2019-02-25 DIAGNOSIS — R030 Elevated blood-pressure reading, without diagnosis of hypertension: Secondary | ICD-10-CM | POA: Diagnosis not present

## 2019-02-25 DIAGNOSIS — E669 Obesity, unspecified: Secondary | ICD-10-CM

## 2019-02-26 LAB — CBC WITH DIFFERENTIAL/PLATELET
Absolute Monocytes: 567 cells/uL (ref 200–950)
Basophils Absolute: 32 cells/uL (ref 0–200)
Basophils Relative: 0.5 %
Eosinophils Absolute: 189 cells/uL (ref 15–500)
Eosinophils Relative: 3 %
HCT: 40.9 % (ref 38.5–50.0)
Hemoglobin: 13.9 g/dL (ref 13.2–17.1)
Lymphs Abs: 2066 cells/uL (ref 850–3900)
MCH: 29.4 pg (ref 27.0–33.0)
MCHC: 34 g/dL (ref 32.0–36.0)
MCV: 86.5 fL (ref 80.0–100.0)
MPV: 10.8 fL (ref 7.5–12.5)
Monocytes Relative: 9 %
Neutro Abs: 3446 cells/uL (ref 1500–7800)
Neutrophils Relative %: 54.7 %
Platelets: 239 10*3/uL (ref 140–400)
RBC: 4.73 10*6/uL (ref 4.20–5.80)
RDW: 13.1 % (ref 11.0–15.0)
Total Lymphocyte: 32.8 %
WBC: 6.3 10*3/uL (ref 3.8–10.8)

## 2019-02-26 LAB — COMPLETE METABOLIC PANEL WITH GFR
AG Ratio: 1.5 (calc) (ref 1.0–2.5)
ALT: 10 U/L (ref 9–46)
AST: 12 U/L (ref 10–35)
Albumin: 3.7 g/dL (ref 3.6–5.1)
Alkaline phosphatase (APISO): 75 U/L (ref 35–144)
BUN: 16 mg/dL (ref 7–25)
CO2: 26 mmol/L (ref 20–32)
Calcium: 8.8 mg/dL (ref 8.6–10.3)
Chloride: 103 mmol/L (ref 98–110)
Creat: 1.24 mg/dL (ref 0.70–1.25)
GFR, Est African American: 70 mL/min/{1.73_m2} (ref >=60)
GFR, Est Non African American: 60 mL/min/{1.73_m2} (ref >=60)
Globulin: 2.5 g/dL (calc) (ref 1.9–3.7)
Glucose, Bld: 100 mg/dL — ABNORMAL HIGH (ref 65–99)
Potassium: 4.2 mmol/L (ref 3.5–5.3)
Sodium: 138 mmol/L (ref 135–146)
Total Bilirubin: 0.4 mg/dL (ref 0.2–1.2)
Total Protein: 6.2 g/dL (ref 6.1–8.1)

## 2019-02-26 LAB — LIPID PANEL
Cholesterol: 220 mg/dL — ABNORMAL HIGH (ref <200)
HDL: 49 mg/dL (ref >=40)
LDL Cholesterol (Calc): 151 mg/dL (calc) — ABNORMAL HIGH
Non-HDL Cholesterol (Calc): 171 mg/dL (calc) — ABNORMAL HIGH (ref <130)
Total CHOL/HDL Ratio: 4.5 (calc) (ref <5.0)
Triglycerides: 95 mg/dL (ref <150)

## 2019-02-26 LAB — PSA: PSA: 0.7 ng/mL (ref <=4.0)

## 2019-02-26 LAB — HEMOGLOBIN A1C
Hgb A1c MFr Bld: 6.3 % of total Hgb — ABNORMAL HIGH (ref <5.7)
Mean Plasma Glucose: 134 (calc)
eAG (mmol/L): 7.4 (calc)

## 2019-03-04 ENCOUNTER — Ambulatory Visit (INDEPENDENT_AMBULATORY_CARE_PROVIDER_SITE_OTHER): Payer: Medicare Other | Admitting: Family Medicine

## 2019-03-04 ENCOUNTER — Other Ambulatory Visit: Payer: Self-pay | Admitting: Family Medicine

## 2019-03-04 ENCOUNTER — Other Ambulatory Visit: Payer: Self-pay

## 2019-03-04 ENCOUNTER — Encounter: Payer: Self-pay | Admitting: Family Medicine

## 2019-03-04 DIAGNOSIS — E782 Mixed hyperlipidemia: Secondary | ICD-10-CM | POA: Diagnosis not present

## 2019-03-04 DIAGNOSIS — E669 Obesity, unspecified: Secondary | ICD-10-CM

## 2019-03-04 DIAGNOSIS — R7303 Prediabetes: Secondary | ICD-10-CM

## 2019-03-04 DIAGNOSIS — R030 Elevated blood-pressure reading, without diagnosis of hypertension: Secondary | ICD-10-CM

## 2019-03-04 DIAGNOSIS — R351 Nocturia: Secondary | ICD-10-CM

## 2019-03-04 NOTE — Assessment & Plan Note (Signed)
Previously stable BP, episodic elevated due to white coat HTN - Home BP readings - none  No known complications    Plan:  1. Never on anti-HTN therapy - remain off now, no new dx 2. Encourage improved lifestyle - low sodium diet, reduce soda intake, inc water, regular exercise 3. Again advised him to start monitor BP outside office occasionally, bring readings to next visit, if persistently >140/90 or new symptoms notify office sooner

## 2019-03-04 NOTE — Assessment & Plan Note (Signed)
Relatively stable PreDM at A1c 6.3, from prior 6.2 Concern with obesity, HLD  Plan:  Discussed risk of progression to DM 1. Not on any therapy currently  2. Encourage improved lifestyle - low carb, low sugar diet, reduce portion size, continue improving regular exercise 3. Follow-up yearly A1c - offered 6 month check, not interested again

## 2019-03-04 NOTE — Progress Notes (Signed)
Subjective:    Patient ID: Ricky Graves, male    DOB: 13-Jun-1951, 68 y.o.   MRN: [id number]  Ricky Graves is a 68 y.o. male presenting on 03/04/2019 for Follow-up  Virtual / Telehealth Encounter - Telephone  The purpose of this virtual visit is to provide medical care while limiting exposure to the novel coronavirus (COVID19) for both patient and office staff.  Consent was obtained for remote visit:  Yes.   Answered questions that patient had about telehealth interaction:  Yes.   I discussed the limitations, risks, security and privacy concerns of performing an evaluation and management service by video/telephone. I also discussed with the patient that there may be a patient responsible charge related to this service. The patient expressed understanding and agreed to proceed.  Patient Location: Home Provider Location: [facility] (Office)  HPI   Pre-Diabetes Last A1c 6.3, on recent labs. Previously A1c 6.2 He has improved lifestyle, but more sedentary in winter. CBGs: Not checking CBG Meds: Never on meds Currently not on ACEi / ARB Lifestyle: Currently weight at 207-208 lbs - Diet: Mostly unchanged except reduced soda intake now mostly quit sodas, drinking more water. - Exercise: Less active in winter, activity outdoors in yard and physical exertion Denies hypoglycemia, numbness tingling weakness  Elevated BP without diagnosis of HTN Not checking BP outside office. History of "white coat syndrome". He feels fine. Current Meds -None (never on) Denies CP, dyspnea, HA, edema, dizziness / lightheadedness  HYPERLIPIDEMIA: - Reports no concerns. Last lipid panel 02/2019, mixed results with normal HDL and improved TG but now still elevated LDL - Never on cholesterol medicine, he is still not interested due to experience with people who did not tolerate well and does not think he needs it, he prefers lifestyle  PMH - History of Environmental / Seasonal  Allergies, History of Basal Cell Carcinoma   Health Maintenance: - Colon Cancer Screening: Never had colonoscopy. He has done FOBT negative (01/2017). No known family history of colon cancer. DUE for colon CA screening. Offered again with options FOBT, Cologuard, Colonoscopy - emphasized importance of screening, he is not worried because he has no family history. Encouraged him to reconsider.  - Prostate Cancer Screening: Last PSA 0.7 (02/2019) unchanged. Past DRE reportedly normal. No family or personal history of prostate cancer. He is asymptomatic without LUTS.  Declines PNA vaccine, since quit smoking, he does not feel like he is at risk for lung problem.  Due for routine Hep C and HIV screening, he declines   Depression screen [facility] 2/9 03/04/2019 09/04/2018 02/28/2018  Decreased Interest 0 0 0  Down, Depressed, Hopeless 0 0 0  PHQ - 2 Score 0 0 0  Altered sleeping - - -  Tired, decreased energy - - -  Change in appetite - - -  Feeling bad or failure about yourself  - - -  Trouble concentrating - - -  Moving slowly or fidgety/restless - - -  Suicidal thoughts - - -  PHQ-9 Score - - -    Past Medical History:  Diagnosis Date   Adult acne    Cancer of the skin, basal cell    Past Surgical History:  Procedure Laterality Date   TONSILLECTOMY     VASECTOMY  1980   Social History   Socioeconomic History   Marital status: Married    Spouse name: Not on file   Number of children: Not on file   Years of education: Vocational  Highest education level: Some college, no degree  Occupational History   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, product/process development strain: Not hard at [employer] insecurity:    Worry: Never true    Inability: Never true   Transportation needs:    Medical: No    Non-medical: No  Tobacco Use   Smoking status: Former Smoker    Packs/day: 1.50    Years: 30.00    Pack years: 45.00     Types: Cigarettes    Last attempt to quit: 05/15/2008    Years since quitting: 10.8   Smokeless tobacco: Never Used  Substance and Sexual Activity   Alcohol use: Yes   Drug use: No   Sexual activity: Not on file  Lifestyle   Physical activity:    Days per week: 0 days    Minutes per session: 0 min   Stress: Not at all  Relationships   Social connections:    Talks on phone: More than three times a week    Gets together: More than three times a week    Attends religious service: Never    Active member of club or organization: No    Attends meetings of clubs or organizations: Never    Relationship status: Married   Intimate partner violence:    Fear of current or ex partner: No    Emotionally abused: No    Physically abused: No    Forced sexual activity: No  Other Topics Concern   Not on file  Social History Narrative   Not on file   Family History  Problem Relation Age of Onset   Heart disease Father    Stroke Father    Diabetes Father    Diabetes Paternal Grandmother    Current Outpatient Medications on File Prior to Visit  Medication Sig   acetaminophen (TYLENOL) 325 MG tablet Take 650 mg by mouth every 6 (six) hours as needed.   No current facility-administered medications on file prior to visit.     Review of Systems Per HPI unless specifically indicated above     Objective:    There were no vitals taken for this visit.  Wt Readings from Last 3 Encounters:  09/04/18 206 lb 6.4 oz (93.6 kg)  02/28/18 216 lb (98 kg)  08/23/17 221 lb (100.2 kg)    Physical Exam   No physical exam done due to remote telephone encounter.  Results for orders placed or performed in visit on 02/25/19  PSA  Result Value Ref Range   PSA 0.7 < OR = 4.0 ng/mL  Lipid panel  Result Value Ref Range   Cholesterol 220 (H) <200 mg/dL   HDL 49 > OR = 40 mg/dL   Triglycerides 95 <150 mg/dL   LDL Cholesterol (Calc) 151 (H) mg/dL (calc)   Total CHOL/HDL Ratio 4.5 <5.0  (calc)   Non-HDL Cholesterol (Calc) 171 (H) <130 mg/dL (calc)  COMPLETE METABOLIC PANEL WITH GFR  Result Value Ref Range   Glucose, Bld 100 (H) 65 - 99 mg/dL   BUN 16 7 - 25 mg/dL   Creat 1.24 0.70 - 1.25 mg/dL   GFR, Est Non African American 60 > OR = 60 mL/min/1.82m2   GFR, Est African American 70 > OR = 60 mL/min/1.73m2   BUN/Creatinine Ratio NOT APPLICABLE 6 - 22 (calc)   Sodium 138 135 - 146 mmol/L   Potassium 4.2 3.5 - 5.3 mmol/L  Chloride 103 98 - 110 mmol/L   CO2 26 20 - 32 mmol/L   Calcium 8.8 8.6 - 10.3 mg/dL   Total Protein 6.2 6.1 - 8.1 g/dL   Albumin 3.7 3.6 - 5.1 g/dL   Globulin 2.5 1.9 - 3.7 g/dL (calc)   AG Ratio 1.5 1.0 - 2.5 (calc)   Total Bilirubin 0.4 0.2 - 1.2 mg/dL   Alkaline phosphatase (APISO) 75 35 - 144 U/L   AST 12 10 - 35 U/L   ALT 10 9 - 46 U/L  CBC with Differential/Platelet  Result Value Ref Range   WBC 6.3 3.8 - 10.8 Thousand/uL   RBC 4.73 4.20 - 5.80 Million/uL   Hemoglobin 13.9 13.2 - 17.1 g/dL   HCT 40.9 38.5 - 50.0 %   MCV 86.5 80.0 - 100.0 fL   MCH 29.4 27.0 - 33.0 pg   MCHC 34.0 32.0 - 36.0 g/dL   RDW 13.1 11.0 - 15.0 %   Platelets 239 140 - 400 Thousand/uL   MPV 10.8 7.5 - 12.5 fL   Neutro Abs 3,446 1,500 - 7,800 cells/uL   Lymphs Abs 2,066 850 - 3,900 cells/uL   Absolute Monocytes 567 200 - 950 cells/uL   Eosinophils Absolute 189 15 - 500 cells/uL   Basophils Absolute 32 0 - 200 cells/uL   Neutrophils Relative % 54.7 %   Total Lymphocyte 32.8 %   Monocytes Relative 9.0 %   Eosinophils Relative 3.0 %   Basophils Relative 0.5 %  Hemoglobin A1c  Result Value Ref Range   Hgb A1c MFr Bld 6.3 (H) <5.7 % of total Hgb   Mean Plasma Glucose 134 (calc)   eAG (mmol/L) 7.4 (calc)      Assessment & Plan:   Problem List Items Addressed This Visit    Elevated BP without diagnosis of hypertension    Previously stable BP, episodic elevated due to white coat HTN - Home BP readings - none  No known complications    Plan:  1. Never on  anti-HTN therapy - remain off now, no new dx 2. Encourage improved lifestyle - low sodium diet, reduce soda intake, inc water, regular exercise 3. Again advised him to start monitor BP outside office occasionally, bring readings to next visit, if persistently >140/90 or new symptoms notify office sooner      Hyperlipidemia    Mixed results cholesterol, elevated LDL, improved TG and HDL. Last lipid panel 02/2019 Calculated ASCVD 10 yr risk score 15.1%  Plan: 1. Discussed ASCVD risk - offered Statin, he has declined again - Defer ASA as well, this would be other recommendation for him - Encourage improved lifestyle - low carb/cholesterol, reduce portion size, continue improving regular exercise - Yearly lipid      Obesity (BMI 30.0-34.9)    Weight stable, encourage continued lifestyle improvement      Pre-diabetes - Primary    Relatively stable PreDM at A1c 6.3, from prior 6.2 Concern with obesity, HLD  Plan:  Discussed risk of progression to DM 1. Not on any therapy currently  2. Encourage improved lifestyle - low carb, low sugar diet, reduce portion size, continue improving regular exercise 3. Follow-up yearly A1c - offered 6 month check, not interested again         No orders of the defined types were placed in this encounter.  Follow-up: - Return in 1 year for Yearly medicare checkup - Future labs 02/24/20 - Will recommend add urine microalbumin in future   Patient verbalizes  understanding with the above medical recommendations including the limitation of remote medical advice.  Specific follow-up and call-back criteria were given for patient to follow-up or seek medical care more urgently if needed.  Total duration of direct patient care provided via telephone: 12 minutes   Nobie Putnam, [facility] 03/04/2019, 8:50 AM

## 2019-03-04 NOTE — Assessment & Plan Note (Addendum)
Weight stable, encourage continued lifestyle improvement 

## 2019-03-04 NOTE — Patient Instructions (Addendum)
Keep improving diet and lifestyle. Reduce soda. Stay active exercise. Goal to keep losing weight  Recent Labs    02/25/19 0813  HGBA1C 6.3*    Blood sugar still elevated, at risk of future diabetes.  Blood cholesterol is still elevated. At risk of future heart attack and stroke. We recommend a Statin medication, however if you change your mind and would like to start this, notify our office.  Recommend return in 6 months for PreDM A1c - but if you decline to return then we can still follow-up in 1 year  DUE for FASTING BLOOD WORK (no food or drink after midnight before the lab appointment, only water or coffee without cream/sugar on the morning of)  SCHEDULE "Lab Only" visit in the morning at the clinic for lab draw in 1 YEAR  - Make sure Lab Only appointment is at about 1 week before your next appointment, so that results will be available  For Lab Results, once available within 2-3 days of blood draw, you can can log in to MyChart online to view your results and a brief explanation. Also, we can discuss results at next follow-up visit.   Please schedule a Follow-up Appointment to: Return in about 1 year (around 03/03/2020) for Yearly Medicare Checkup.  If you have any other questions or concerns, please feel free to call the office or send a message through [place]. You may also schedule an earlier appointment if necessary.  Additionally, you may be receiving a survey about your experience at our office within a few days to 1 week by e-mail or mail. We value your feedback.  Nobie Putnam, DO [place]

## 2019-03-04 NOTE — Assessment & Plan Note (Signed)
Mixed results cholesterol, elevated LDL, improved TG and HDL. Last lipid panel 02/2019 Calculated ASCVD 10 yr risk score 15.1%  Plan: 1. Discussed ASCVD risk - offered Statin, he has declined again - Defer ASA as well, this would be other recommendation for him - Encourage improved lifestyle - low carb/cholesterol, reduce portion size, continue improving regular exercise - Yearly lipid

## 2019-03-14 ENCOUNTER — Ambulatory Visit (INDEPENDENT_AMBULATORY_CARE_PROVIDER_SITE_OTHER): Payer: Medicare Other | Admitting: Family Medicine

## 2019-03-14 ENCOUNTER — Encounter: Payer: Self-pay | Admitting: Family Medicine

## 2019-03-14 ENCOUNTER — Other Ambulatory Visit: Payer: Self-pay

## 2019-03-14 DIAGNOSIS — J0100 Acute maxillary sinusitis, unspecified: Secondary | ICD-10-CM

## 2019-03-14 DIAGNOSIS — J3089 Other allergic rhinitis: Secondary | ICD-10-CM | POA: Diagnosis not present

## 2019-03-14 MED ORDER — IPRATROPIUM BROMIDE 0.06 % NA SOLN: 2.0000 | Freq: Four times a day (QID) | NASAL | 0 refills | Status: DC

## 2019-03-14 MED ORDER — AMOXICILLIN-POT CLAVULANATE 875-125 MG PO TABS: 1.0000 | ORAL_TABLET | Freq: Two times a day (BID) | ORAL | 0 refills | Status: DC

## 2019-03-14 NOTE — Progress Notes (Signed)
Virtual Visit via Telephone The purpose of this virtual visit is to provide medical care while limiting exposure to the novel coronavirus (COVID19) for both patient and office staff.  Consent was obtained for phone visit:  Yes.   Answered questions that patient had about telehealth interaction:  Yes.   I discussed the limitations, risks, security and privacy concerns of performing an evaluation and management service by telephone. I also discussed with the patient that there may be a patient responsible charge related to this service. The patient expressed understanding and agreed to proceed.  Patient Location: Home Provider Location: [facility])   ---------------------------------------------------------------------- Chief Complaint  Patient presents with  . Sinusitis    facial pressure, left side swollen and ear pain Left side, swollen glands onset 3 days and low grade fever onset yesterday and today around 100 F OTC denies cough and SOB     S: Reviewed CMA documentation. I have called patient and gathered additional HPI as follows:  SINUSITIS / EAR PAIN Reports that symptoms started 3 days ago with worsening sinus pain and pressure and congestion, and left ear pain. No sick contacts. - Tried OTC Tylenol, Ibuprofen, Sudafed, Mucinex  Patient currently in home isolation Denies any high risk travel to areas of current concern for COVID19. Denies any known or suspected exposure to person with or possibly with COVID19.  Denies any fevers, chills, sweats, body ache, cough, shortness of breath, headache, abdominal pain, diarrhea  -------------------------------------------------------------------------- O: No physical exam performed due to remote telephone encounter.  -------------------------------------------------------------------------- A&P:  Suspected Acute Sinusitis, possible for allergies at onset - now concern with progression of symptoms, and cannot  rule out bacterial infection. - Reassuring without high risk symptoms - Afebrile, without dyspnea - No comorbid pulmonary conditions (asthma, COPD) or immunocompromise  - Currently patient is LOW RISK for COVID19 based on current symptoms and no known travel/exposure - however at this time, now [place] is currently within phase of community spread, and therefore patient can still be potentially exposed. Cannot rule out case with mild symptoms. Testing is not recommended at this time due to mild symptoms.  1. Start Atrovent nasal spray decongestant 2 sprays in each nostril up to 4 times daily for 7 days 2. Continue other OTC supportive care meds, mucinex, anti histamine 3. Given timing and access to care during coronavirus pandemic, agree to provide back up plan antibiotic augmentin - only fill within 48-72 hours if worsening or not improve  Meds ordered this encounter  Medications  . ipratropium (ATROVENT) 0.06 % nasal spray    Sig: Place 2 sprays into both nostrils 4 (four) times daily. For up to 5-7 days then stop.    Dispense:  15 mL    Refill:  0  . amoxicillin-clavulanate (AUGMENTIN) 875-125 MG tablet    Sig: Take 1 tablet by mouth 2 (two) times daily. For 10 days    Dispense:  20 tablet    Refill:  0   OPTIONAL RECOMMENDED self quarantine for patient safety for PREVENTION ONLY. It is not required based on current clinical symptoms. If they were to develop fever or worsening shortness of breath, then emphasis on REQUIRED quarantine for up to 7-14 days that could be resolved if fever free >3 days AND if symptoms improving after 7 days.   If symptoms do not resolve or significantly improve OR if WORSENING - fever / cough - or worsening shortness of breath - then should contact us and seek advice on  next steps in treatment at home vs where/when to seek care at Urgent Care or Hospital ED for further intervention and possible testing if indicated.  Patient verbalizes understanding with the  above medical recommendations including the limitation of remote medical advice.  Specific follow-up / call-back criteria were given for patient to follow-up or seek medical care more urgently if needed.   - Time spent in direct consultation with patient on phone: 8 minutes  Nobie Putnam, [facility] 03/14/2019, 9:30 AM

## 2019-03-14 NOTE — Patient Instructions (Addendum)
AVS info given over the phone.

## 2019-08-13 DIAGNOSIS — Z23 Encounter for immunization: Secondary | ICD-10-CM | POA: Diagnosis not present

## 2019-09-10 ENCOUNTER — Other Ambulatory Visit: Payer: Self-pay

## 2019-09-10 ENCOUNTER — Ambulatory Visit (INDEPENDENT_AMBULATORY_CARE_PROVIDER_SITE_OTHER): Payer: Medicare Other

## 2019-09-10 VITALS — BP 152/85 | HR 68 | Temp 97.6°F | Resp 16 | Ht 70.0 in | Wt 218.6 lb

## 2019-09-10 DIAGNOSIS — Z0000 Encounter for general adult medical examination without abnormal findings: Secondary | ICD-10-CM

## 2019-09-10 NOTE — Progress Notes (Signed)
Subjective:   Ricky Graves is a 68 y.o. male who presents for Medicare Annual/Subsequent preventive examination.  This visit is being conducted via phone call  - after an attmept to do on video chat - due to the COVID-19 pandemic. This patient has given me verbal consent via phone to conduct this visit, patient states they are participating from their home address. Some vital signs may be absent or patient reported.   Patient identification: identified by name, DOB, and current address.    Review of Systems:  Cardiac Risk Factors include: advanced age (>36men, >51 women);male gender;smoking/ tobacco exposure     Objective:    Vitals: BP (!) 152/85 (BP Location: Right Arm, Patient Position: Sitting, Cuff Size: Normal)   Pulse 68   Temp 97.6 F (36.4 C) (Oral)   Resp 16   Ht 5\' 10"  (1.778 m)   Wt 218 lb 9.6 oz (99.2 kg)   BMI 31.37 kg/m   Body mass index is 31.37 kg/m.  Advanced Directives 09/10/2019 09/04/2018 08/23/2017  Does Patient Have a Medical Advance Directive? No No No  Does patient want to make changes to medical advance directive? - - Yes (MAU/Ambulatory/Procedural Areas - Information given)  Would patient like information on creating a medical advance directive? Yes (MAU/Ambulatory/Procedural Areas - Information given) No - Patient declined -    Tobacco Social History   Tobacco Use  Smoking Status Former Smoker  . Packs/day: 1.50  . Years: 30.00  . Pack years: 45.00  . Types: Cigarettes  . Quit date: 05/15/2008  . Years since quitting: 11.3  Smokeless Tobacco Never Used     Counseling given: Not Answered   Clinical Intake:  Pre-visit preparation completed: Yes  Pain : No/denies pain     Nutritional Status: BMI > 30  Obese Nutritional Risks: None Diabetes: No  How often do you need to have someone help you when you read instructions, pamphlets, or other written materials from your doctor or pharmacy?: 1 - Never  Interpreter Needed?: No   Information entered by :: Melvie Paglia,LPN  Past Medical History:  Diagnosis Date  . Adult acne   . Cancer of the skin, basal cell    Past Surgical History:  Procedure Laterality Date  . TONSILLECTOMY    . VASECTOMY  1980   Family History  Problem Relation Age of Onset  . Heart disease Father   . Stroke Father   . Diabetes Father   . Diabetes Paternal Grandmother    Social History   Socioeconomic History  . Marital status: Married    Spouse name: Not on file  . Number of children: Not on file  . Years of education: Vocational  . Highest education level: Some college, no degree  Occupational History  . 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 Financial resource strain: Not hard at all  . Food insecurity    Worry: Never true    Inability: Never true  . Transportation needs    Medical: No    Non-medical: No  Tobacco Use  . Smoking status: Former Smoker    Packs/day: 1.50    Years: 30.00    Pack years: 45.00    Types: Cigarettes    Quit date: 05/15/2008    Years since quitting: 11.3  . Smokeless tobacco: Never Used  Substance and Sexual Activity  . Alcohol use: Yes    Comment: occasioanlly  . Drug use: No  .  Sexual activity: Not on file  Lifestyle  . Physical activity    Days per week: 0 days    Minutes per session: 0 min  . Stress: Not at all  Relationships  . Social connections    Talks on phone: More than three times a week    Gets together: More than three times a week    Attends religious service: Never    Active member of club or organization: No    Attends meetings of clubs or organizations: Never    Relationship status: Married  Other Topics Concern  . Not on file  Social History Narrative  . Not on file    Outpatient Encounter Medications as of 09/10/2019  Medication Sig  . acetaminophen (TYLENOL) 325 MG tablet Take 650 mg by mouth every 6 (six) hours as needed.  Marland Kitchen ibuprofen (ADVIL) 400 MG  tablet Take 400 mg by mouth every 6 (six) hours as needed. As needed, alternates with tyl  . pseudoephedrine (SUDAFED) 30 MG tablet Take 30 mg by mouth every 4 (four) hours as needed for congestion. As needed  . [DISCONTINUED] amoxicillin-clavulanate (AUGMENTIN) 875-125 MG tablet Take 1 tablet by mouth 2 (two) times daily. For 10 days  . [DISCONTINUED] ipratropium (ATROVENT) 0.06 % nasal spray Place 2 sprays into both nostrils 4 (four) times daily. For up to 5-7 days then stop.   No facility-administered encounter medications on file as of 09/10/2019.     Activities of Daily Living In your present state of health, do you have any difficulty performing the following activities: 09/10/2019  Hearing? Y  Comment no hearing aids  Vision? N  Comment no eyeglasses, [facility] annually  Difficulty concentrating or making decisions? N  Walking or climbing stairs? N  Dressing or bathing? N  Doing errands, shopping? N  Preparing Food and eating ? N  Using the Toilet? N  In the past six months, have you accidently leaked urine? N  Do you have problems with loss of bowel control? N  Managing your Medications? N  Managing your Finances? N  Housekeeping or managing your Housekeeping? N  Some recent data might be hidden    Patient Care Team: Olin Hauser, DO as PCP - General (Family Medicine)   Assessment:   This is a routine wellness examination for [facility].  Exercise Activities and Dietary recommendations Current Exercise Habits: The patient does not participate in regular exercise at present(active at home), Exercise limited by: None identified  Goals    . DIET - INCREASE WATER INTAKE     Recommend drinking at least 6-8 glasses of water a day     . Weight (lb) < 200 lb (90.7 kg)     Wants to be at 165 lbs quit smoking made weight gain 221 lbs       Fall Risk: Fall Risk  09/10/2019 03/14/2019 03/04/2019 09/04/2018 02/28/2018  Falls in the past year? 0 0 0 No No   Number falls in past yr: 0 - - - -  Injury with Fall? 0 - - - -  Risk for fall due to : - - - - -  Follow up - Falls evaluation completed Falls evaluation completed - -    FALL RISK PREVENTION PERTAINING TO THE HOME:  Any stairs in or around the home? Yes  If so, are there any without handrails? No   Home free of loose throw rugs in walkways, pet beds, electrical cords, etc? Yes  Adequate lighting in  your home to reduce risk of falls? Yes   ASSISTIVE DEVICES UTILIZED TO PREVENT FALLS:  Life alert? No  Use of a cane, walker or w/c? No  Grab bars in the bathroom? No  Shower chair or bench in shower? No  Elevated toilet seat or a handicapped toilet? No   TIMED UP AND GO:  Was the test performed? Yes .  Length of time to ambulate 10 feet: 11 sec.   GAIT:  Appearance of gait: Gait steady and fast without the use of an assistive device.  Education: Fall risk prevention has been discussed.  Intervention(s) required? No  DME/home health order needed?  No   Depression Screen PHQ 2/9 Scores 09/10/2019 03/14/2019 03/04/2019 09/04/2018  PHQ - 2 Score 0 0 0 0  PHQ- 9 Score - - - -    Cognitive Function MMSE - Mini Mental State Exam 08/23/2017  Orientation to time 5  Orientation to Place 5  Registration 3  Attention/ Calculation 5  Recall 3  Language- name 2 objects 2  Language- repeat 1  Language- follow 3 step command 3  Language- read & follow direction 1  Write a sentence 1  Copy design 1  Total score 30     6CIT Screen 09/10/2019 09/04/2018  What Year? 0 points 0 points  What month? 0 points 0 points  What time? 0 points 0 points  Count back from 20 0 points 0 points  Months in reverse 0 points 0 points  Repeat phrase 0 points 0 points  Total Score 0 0    Immunization History  Administered Date(s) Administered  . Influenza, High Dose Seasonal PF 08/23/2017, 08/13/2019  . Pneumococcal Conjugate-13 09/04/2018  . Pneumococcal Polysaccharide-23 08/13/2019     Qualifies for Shingles Vaccine? Yes  Zostavax completed n/a. Due for Shingrix. Education has been provided regarding the importance of this vaccine. Pt has been advised to call insurance company to determine out of pocket expense. Advised may also receive vaccine at local pharmacy or Health Dept. Verbalized acceptance and understanding.  Tdap: up to date   Flu Vaccine: up to date   Pneumococcal Vaccine: up to date  Screening Tests Health Maintenance  Topic Date Due  . COLONOSCOPY  09/09/2020 (Originally 05/14/2002)  . Hepatitis C Screening  09/09/2020 (Originally 05-27-1951)  . TETANUS/TDAP  01/20/2027  . INFLUENZA VACCINE  Completed  . PNA vac Low Risk Adult  Completed   Cancer Screenings:  Colorectal Screening: declined all screenings.   Lung Cancer Screening: (Low Dose CT Chest recommended if Age 16-80 years, 30 pack-year currently smoking OR have quit w/in 15years.) does not qualify.   Additional Screening:  Hepatitis C Screening: does qualify: declined  Vision Screening: Recommended annual ophthalmology exams for early detection of glaucoma and other disorders of the eye. Is the patient up to date with their annual eye exam?  Yes  Who is the provider or what is the name of the office in which the pt attends annual eye exams? [facility]  Dental Screening: Recommended annual dental exams for proper oral hygiene  Community Resource Referral:  CRR required this visit?  No        Plan:  I have personally reviewed and addressed the Medicare Annual Wellness questionnaire and have noted the following in the patient's chart:  A. Medical and social history B. Use of alcohol, tobacco or illicit drugs  C. Current medications and supplements D. Functional ability and status E.  Nutritional status F.  Physical  activity G. Advance directives H. List of other physicians I.  Hospitalizations, surgeries, and ER visits in previous 12 months J.  [place] such as  hearing and vision if needed, cognitive and depression L. Referrals and appointments   In addition, I have reviewed and discussed with patient certain preventive protocols, quality metrics, and best practice recommendations. A written personalized care plan for preventive services as well as general preventive health recommendations were provided to patient.   Signed,   Bevelyn Ngo, LPN  [id number] Nurse Health Advisor   Nurse Notes: none

## 2019-09-10 NOTE — Patient Instructions (Signed)
[name] , Thank you for taking time to come for your Medicare Wellness Visit. I appreciate your ongoing commitment to your health goals. Please review the following plan we discussed and let me know if I can assist you in the future.   Screening recommendations/referrals: Colonoscopy: declined Recommended yearly ophthalmology/optometry visit for glaucoma screening and checkup Recommended yearly dental visit for hygiene and checkup  Vaccinations: Influenza vaccine: up to date Pneumococcal vaccine: up to date Tdap vaccine: up to date Shingles vaccine: shingrix eligible    Advanced directives: Advance directive discussed with you today. Once this is complete please bring a copy in to our office so we can scan it into your chart.  Conditions/risks identified: past history of smoking, declined lung cancer screening at this time   Next appointment: follow up in one year for your annual wellness visit.   Preventive Care 67 Years and Older, Male Preventive care refers to lifestyle choices and visits with your health care provider that can promote health and wellness. What does preventive care include?  A yearly physical exam. This is also called an annual well check.  Dental exams once or twice a year.  Routine eye exams. Ask your health care provider how often you should have your eyes checked.  Personal lifestyle choices, including:  Daily care of your teeth and gums.  Regular physical activity.  Eating a healthy diet.  Avoiding tobacco and drug use.  Limiting alcohol use.  Practicing safe sex.  Taking low doses of aspirin every day.  Taking vitamin and mineral supplements as recommended by your health care provider. What happens during an annual well check? The services and screenings done by your health care provider during your annual well check will depend on your age, overall health, lifestyle risk factors, and family history of disease. Counseling  Your health  care provider may ask you questions about your:  Alcohol use.  Tobacco use.  Drug use.  Emotional well-being.  Home and relationship well-being.  Sexual activity.  Eating habits.  History of falls.  Memory and ability to understand (cognition).  Work and work Statistician. Screening  You may have the following tests or measurements:  Height, weight, and BMI.  Blood pressure.  Lipid and cholesterol levels. These may be checked every 5 years, or more frequently if you are over 65 years old.  Skin check.  Lung cancer screening. You may have this screening every year starting at age 67 if you have a 30-pack-year history of smoking and currently smoke or have quit within the past 15 years.  Fecal occult blood test (FOBT) of the stool. You may have this test every year starting at age 67.  Flexible sigmoidoscopy or colonoscopy. You may have a sigmoidoscopy every 5 years or a colonoscopy every 10 years starting at age 67.  Prostate cancer screening. Recommendations will vary depending on your family history and other risks.  Hepatitis C blood test.  Hepatitis B blood test.  Sexually transmitted disease (STD) testing.  Diabetes screening. This is done by checking your blood sugar (glucose) after you have not eaten for a while (fasting). You may have this done every 1-3 years.  Abdominal aortic aneurysm (AAA) screening. You may need this if you are a current or former smoker.  Osteoporosis. You may be screened starting at age 67 if you are at high risk. Talk with your health care provider about your test results, treatment options, and if necessary, the need for more tests. Vaccines  Your  health care provider may recommend certain vaccines, such as:  Influenza vaccine. This is recommended every year.  Tetanus, diphtheria, and acellular pertussis (Tdap, Td) vaccine. You may need a Td booster every 10 years.  Zoster vaccine. You may need this after age 45.   Pneumococcal 13-valent conjugate (PCV13) vaccine. One dose is recommended after age 67.  Pneumococcal polysaccharide (PPSV23) vaccine. One dose is recommended after age 67. Talk to your health care provider about which screenings and vaccines you need and how often you need them. This information is not intended to replace advice given to you by your health care provider. Make sure you discuss any questions you have with your health care provider. Document Released: 12/04/2015 Document Revised: 07/27/2016 Document Reviewed: 09/08/2015 Elsevier Interactive Patient Education  2017 [place] Prevention in the Home Falls can cause injuries. They can happen to people of all ages. There are many things you can do to make your home safe and to help prevent falls. What can I do on the outside of my home?  Regularly fix the edges of walkways and driveways and fix any cracks.  Remove anything that might make you trip as you walk through a door, such as a raised step or threshold.  Trim any bushes or trees on the path to your home.  Use bright outdoor lighting.  Clear any walking paths of anything that might make someone trip, such as rocks or tools.  Regularly check to see if handrails are loose or broken. Make sure that both sides of any steps have handrails.  Any raised decks and porches should have guardrails on the edges.  Have any leaves, snow, or ice cleared regularly.  Use sand or salt on walking paths during winter.  Clean up any spills in your garage right away. This includes oil or grease spills. What can I do in the bathroom?  Use night lights.  Install grab bars by the toilet and in the tub and shower. Do not use towel bars as grab bars.  Use non-skid mats or decals in the tub or shower.  If you need to sit down in the shower, use a plastic, non-slip stool.  Keep the floor dry. Clean up any water that spills on the floor as soon as it happens.  Remove soap  buildup in the tub or shower regularly.  Attach bath mats securely with double-sided non-slip rug tape.  Do not have throw rugs and other things on the floor that can make you trip. What can I do in the bedroom?  Use night lights.  Make sure that you have a light by your bed that is easy to reach.  Do not use any sheets or blankets that are too big for your bed. They should not hang down onto the floor.  Have a firm chair that has side arms. You can use this for support while you get dressed.  Do not have throw rugs and other things on the floor that can make you trip. What can I do in the kitchen?  Clean up any spills right away.  Avoid walking on wet floors.  Keep items that you use a lot in easy-to-reach places.  If you need to reach something above you, use a strong step stool that has a grab bar.  Keep electrical cords out of the way.  Do not use floor polish or wax that makes floors slippery. If you must use wax, use non-skid floor wax.  Do not  have throw rugs and other things on the floor that can make you trip. What can I do with my stairs?  Do not leave any items on the stairs.  Make sure that there are handrails on both sides of the stairs and use them. Fix handrails that are broken or loose. Make sure that handrails are as long as the stairways.  Check any carpeting to make sure that it is firmly attached to the stairs. Fix any carpet that is loose or worn.  Avoid having throw rugs at the top or bottom of the stairs. If you do have throw rugs, attach them to the floor with carpet tape.  Make sure that you have a light switch at the top of the stairs and the bottom of the stairs. If you do not have them, ask someone to add them for you. What else can I do to help prevent falls?  Wear shoes that:  Do not have high heels.  Have rubber bottoms.  Are comfortable and fit you well.  Are closed at the toe. Do not wear sandals.  If you use a stepladder:  Make  sure that it is fully opened. Do not climb a closed stepladder.  Make sure that both sides of the stepladder are locked into place.  Ask someone to hold it for you, if possible.  Clearly mark and make sure that you can see:  Any grab bars or handrails.  First and last steps.  Where the edge of each step is.  Use tools that help you move around (mobility aids) if they are needed. These include:  Canes.  Walkers.  Scooters.  Crutches.  Turn on the lights when you go into a dark area. Replace any light bulbs as soon as they burn out.  Set up your furniture so you have a clear path. Avoid moving your furniture around.  If any of your floors are uneven, fix them.  If there are any pets around you, be aware of where they are.  Review your medicines with your doctor. Some medicines can make you feel dizzy. This can increase your chance of falling. Ask your doctor what other things that you can do to help prevent falls. This information is not intended to replace advice given to you by your health care provider. Make sure you discuss any questions you have with your health care provider. Document Released: 09/03/2009 Document Revised: 04/14/2016 Document Reviewed: 12/12/2014 Elsevier Interactive Patient Education  2017 [employer].

## 2020-02-24 ENCOUNTER — Other Ambulatory Visit: Payer: Medicare Other

## 2020-02-24 DIAGNOSIS — R7303 Prediabetes: Secondary | ICD-10-CM | POA: Diagnosis not present

## 2020-02-24 DIAGNOSIS — R351 Nocturia: Secondary | ICD-10-CM

## 2020-02-24 DIAGNOSIS — E782 Mixed hyperlipidemia: Secondary | ICD-10-CM | POA: Diagnosis not present

## 2020-02-24 DIAGNOSIS — R030 Elevated blood-pressure reading, without diagnosis of hypertension: Secondary | ICD-10-CM | POA: Diagnosis not present

## 2020-02-24 DIAGNOSIS — E669 Obesity, unspecified: Secondary | ICD-10-CM

## 2020-02-25 LAB — CBC WITH DIFFERENTIAL/PLATELET
Absolute Monocytes: 554 cells/uL (ref 200–950)
Basophils Absolute: 38 cells/uL (ref 0–200)
Basophils Relative: 0.6 %
Eosinophils Absolute: 139 cells/uL (ref 15–500)
Eosinophils Relative: 2.2 %
HCT: 42.8 % (ref 38.5–50.0)
Hemoglobin: 14.4 g/dL (ref 13.2–17.1)
Lymphs Abs: 1903 cells/uL (ref 850–3900)
MCH: 29.3 pg (ref 27.0–33.0)
MCHC: 33.6 g/dL (ref 32.0–36.0)
MCV: 87 fL (ref 80.0–100.0)
MPV: 10.4 fL (ref 7.5–12.5)
Monocytes Relative: 8.8 %
Neutro Abs: 3667 cells/uL (ref 1500–7800)
Neutrophils Relative %: 58.2 %
Platelets: 231 10*3/uL (ref 140–400)
RBC: 4.92 10*6/uL (ref 4.20–5.80)
RDW: 13 % (ref 11.0–15.0)
Total Lymphocyte: 30.2 %
WBC: 6.3 10*3/uL (ref 3.8–10.8)

## 2020-02-25 LAB — HEMOGLOBIN A1C
Hgb A1c MFr Bld: 6.3 % of total Hgb — ABNORMAL HIGH (ref <5.7)
Mean Plasma Glucose: 134 (calc)
eAG (mmol/L): 7.4 (calc)

## 2020-02-25 LAB — LIPID PANEL
Cholesterol: 210 mg/dL — ABNORMAL HIGH (ref <200)
HDL: 49 mg/dL (ref >=40)
LDL Cholesterol (Calc): 137 mg/dL (calc) — ABNORMAL HIGH
Non-HDL Cholesterol (Calc): 161 mg/dL (calc) — ABNORMAL HIGH (ref <130)
Total CHOL/HDL Ratio: 4.3 (calc) (ref <5.0)
Triglycerides: 122 mg/dL (ref <150)

## 2020-02-25 LAB — COMPLETE METABOLIC PANEL WITH GFR
AG Ratio: 1.5 (calc) (ref 1.0–2.5)
ALT: 10 U/L (ref 9–46)
AST: 10 U/L (ref 10–35)
Albumin: 3.7 g/dL (ref 3.6–5.1)
Alkaline phosphatase (APISO): 73 U/L (ref 35–144)
BUN: 15 mg/dL (ref 7–25)
CO2: 25 mmol/L (ref 20–32)
Calcium: 8.7 mg/dL (ref 8.6–10.3)
Chloride: 106 mmol/L (ref 98–110)
Creat: 1.16 mg/dL (ref 0.70–1.25)
GFR, Est African American: 75 mL/min/{1.73_m2} (ref >=60)
GFR, Est Non African American: 65 mL/min/{1.73_m2} (ref >=60)
Globulin: 2.4 g/dL (calc) (ref 1.9–3.7)
Glucose, Bld: 114 mg/dL — ABNORMAL HIGH (ref 65–99)
Potassium: 4.2 mmol/L (ref 3.5–5.3)
Sodium: 139 mmol/L (ref 135–146)
Total Bilirubin: 0.4 mg/dL (ref 0.2–1.2)
Total Protein: 6.1 g/dL (ref 6.1–8.1)

## 2020-02-25 LAB — PSA: PSA: 0.7 ng/mL (ref <=4.0)

## 2020-03-02 ENCOUNTER — Other Ambulatory Visit: Payer: Self-pay

## 2020-03-02 ENCOUNTER — Other Ambulatory Visit: Payer: Self-pay | Admitting: Family Medicine

## 2020-03-02 ENCOUNTER — Encounter: Payer: Self-pay | Admitting: Family Medicine

## 2020-03-02 ENCOUNTER — Ambulatory Visit (INDEPENDENT_AMBULATORY_CARE_PROVIDER_SITE_OTHER): Payer: Medicare Other | Admitting: Family Medicine

## 2020-03-02 VITALS — BP 140/80 | HR 60 | Temp 97.3°F | Resp 16 | Ht 70.0 in | Wt 222.2 lb

## 2020-03-02 DIAGNOSIS — R030 Elevated blood-pressure reading, without diagnosis of hypertension: Secondary | ICD-10-CM | POA: Diagnosis not present

## 2020-03-02 DIAGNOSIS — R7303 Prediabetes: Secondary | ICD-10-CM

## 2020-03-02 DIAGNOSIS — E66811 Obesity, class 1: Secondary | ICD-10-CM

## 2020-03-02 DIAGNOSIS — E782 Mixed hyperlipidemia: Secondary | ICD-10-CM

## 2020-03-02 DIAGNOSIS — E669 Obesity, unspecified: Secondary | ICD-10-CM

## 2020-03-02 DIAGNOSIS — R351 Nocturia: Secondary | ICD-10-CM

## 2020-03-02 NOTE — Patient Instructions (Addendum)
Thank you for coming to the office today.  1. Chemistry - Normal results, including electrolytes, kidney and liver function. Mildly elevated fasting blood sugar   2. Hemoglobin A1c (Diabetes screening) - 6.3, stable from previous 6.2 to 6.3, still in range of Pre-Diabetes (>5.7 to 6.4)   3. PSA Prostate Cancer Screening - 0.7, negative. Unchanged from 0.7 last year.  4. Cholesterol - Abnormal cholesterol results. Mild elevated LDL 137 - but it is improved from last year 151. Other results normal HDL TG. Based on ACSVD risk >15% may start statin. Previously has declined. Will discuss in office.  5. CBC Blood Counts - Normal, no anemia, other abnormality  ---------------------------------------  BP is mild elevated. Please keep track of BP outside office if possible.  I do recommend colon cancer screening in future. Please consider this option.  DUE for FASTING BLOOD WORK (no food or drink after midnight before the lab appointment, only water or coffee without cream/sugar on the morning of)  SCHEDULE "Lab Only" visit in the morning at the clinic for lab draw in 1 YEAR  - Make sure Lab Only appointment is at about 1 week before your next appointment, so that results will be available  For Lab Results, once available within 2-3 days of blood draw, you can can log in to MyChart online to view your results and a brief explanation. Also, we can discuss results at next follow-up visit.   Please schedule a Follow-up Appointment to: Return in about 1 year (around 03/02/2021) for 6 month for [name] then 1 yr for [name].  If you have any other questions or concerns, please feel free to call the office or send a message through [facility]. You may also schedule an earlier appointment if necessary.  Additionally, you may be receiving a survey about your experience at our office within a few days to 1 week by e-mail or mail. We value your feedback.  Ricky Putnam,  DO [place]

## 2020-03-02 NOTE — Assessment & Plan Note (Signed)
Relatively stable PreDM at A1c 6.3 Concern with obesity, HLD  Plan:  1. Not on any therapy currently  2. Encourage improved lifestyle - low carb, low sugar diet, reduce portion size, continue improving regular exercise 3. Follow-up yearly A1c

## 2020-03-02 NOTE — Assessment & Plan Note (Signed)
Mixed results cholesterol, improved LDL but still elevated >130, otherwise improved results Last lipid panel 02/2020 Calculated ASCVD 10 yr risk score 15.1%  Plan: 1. Discussed ASCVD risk - offered Statin, he has declined again - Defer ASA as well, this would be other recommendation for him - Encourage improved lifestyle - low carb/cholesterol, reduce portion size, continue improving regular exercise - Yearly lipid

## 2020-03-02 NOTE — Progress Notes (Signed)
Subjective:    Patient ID: Ricky Graves, male    DOB: 12-Jun-1951, 69 y.o.   MRN: [id number]  Ricky Graves is a 69 y.o. male presenting on 03/02/2020 for Pre-Diabetes, Elevated BP, and Hyperlipidemia   HPI   Pre-Diabetes Prior trend A1c 6.2 to 6.3. Last lab 6.3 (02/2020) He has improved lifestyle overall but lately less active due to rainy weather CBGs:Not checking CBG Meds:Never on meds Currentlynot onACEi / ARB Lifestyle: Weight up 4 lbs in 6 months. - Diet: Improved. drinking more water. - Exercise:in past year they built a walking trail around the house, 3 laps for 1 mile, but now due to rain has been too muddy to walk Denies hypoglycemia, numbness tingling weakness  Elevated BP without diagnosis of HTN Not checking BP outside office. History of "white coat syndrome". Usually BP improves on re-check. Current Meds -None (never on) Denies CP, dyspnea, HA, edema, dizziness / lightheadedness  HYPERLIPIDEMIA: - Reports no concerns. Last lipid panel4/2021,mixed results with normal HDL and TG and has had improved LDL from 151 down to 137 - Never on cholesterol medicine, he is still not interested due to experience with people who did not tolerate well and does not think he needs it, he prefers lifestyle  PMH -History of Environmental / Seasonal Allergies,History of Basal Cell Carcinoma   Health Maintenance: - Colon Cancer Screening: Never had colonoscopy. He has done FOBT negative (01/2017). No known family history of colon cancer.DUE for colon CA screening. Offered again with options FOBT, Cologuard, Colonoscopy - emphasized importance of screening, he is not worried because he has no family history. Encouraged him to reconsider.  -Prostate Cancer Screening: Last PSA0.7 (02/2020) unchanged. Past DRE reportedly normal. No family or personal history of prostate cancer. He is asymptomatic without LUTS.    Depression screen [facility] 2/9 03/02/2020 09/10/2019  03/14/2019  Decreased Interest 0 0 0  Down, Depressed, Hopeless 0 0 0  PHQ - 2 Score 0 0 0  Altered sleeping - - -  Tired, decreased energy - - -  Change in appetite - - -  Feeling bad or failure about yourself  - - -  Trouble concentrating - - -  Moving slowly or fidgety/restless - - -  Suicidal thoughts - - -  PHQ-9 Score - - -    Past Medical History:  Diagnosis Date  . Adult acne   . Cancer of the skin, basal cell    Past Surgical History:  Procedure Laterality Date  . TONSILLECTOMY    . VASECTOMY  1980   Social History   Socioeconomic History  . Marital status: Married    Spouse name: Not on file  . Number of children: Not on file  . Years of education: Vocational  . Highest education level: Some college, no degree  Occupational History  . 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 Smoking status: Former Smoker    Packs/day: 1.50    Years: 30.00    Pack years: 45.00    Types: Cigarettes    Quit date: 05/15/2008    Years since quitting: 11.8  . Smokeless tobacco: Never Used  Substance and Sexual Activity  . Alcohol use: Yes    Comment: occasioanlly  . Drug use: No  . Sexual activity: Not on file  Other Topics Concern  . Not on file  Social History Narrative  . Not on file   Social Determinants of Health  Financial Resource Strain:   . Difficulty of Paying Living Expenses:   Food Insecurity:   . Worried About Charity fundraiser in the Last Year:   . Arboriculturist in the Last Year:   Transportation Needs:   . Film/video editor (Medical):   Marland Kitchen Lack of Transportation (Non-Medical):   Physical Activity:   . Days of Exercise per Week:   . Minutes of Exercise per Session:   Stress:   . Feeling of Stress :   Social Connections:   . Frequency of Communication with Friends and Family:   . Frequency of Social Gatherings with Friends and Family:   . Attends Religious Services:   . Active Member of  Clubs or Organizations:   . Attends Archivist Meetings:   Marland Kitchen Marital Status:   Intimate Partner Violence:   . Fear of Current or Ex-Partner:   . Emotionally Abused:   Marland Kitchen Physically Abused:   . Sexually Abused:    Family History  Problem Relation Age of Onset  . Heart disease Father   . Stroke Father   . Diabetes Father   . Diabetes Paternal Grandmother    Current Outpatient Medications on File Prior to Visit  Medication Sig  . acetaminophen (TYLENOL) 325 MG tablet Take 650 mg by mouth every 6 (six) hours as needed.  Marland Kitchen ibuprofen (ADVIL) 400 MG tablet Take 400 mg by mouth every 6 (six) hours as needed. As needed, alternates with tyl  . pseudoephedrine (SUDAFED) 30 MG tablet Take 30 mg by mouth every 4 (four) hours as needed for congestion. As needed   No current facility-administered medications on file prior to visit.    Review of Systems  Constitutional: Negative for activity change, appetite change, chills, diaphoresis, fatigue and fever.  HENT: Negative for congestion and hearing loss.   Eyes: Negative for visual disturbance.  Respiratory: Negative for apnea, cough, chest tightness, shortness of breath and wheezing.   Cardiovascular: Negative for chest pain, palpitations and leg swelling.  Gastrointestinal: Negative for abdominal pain, anal bleeding, blood in stool, constipation, diarrhea, nausea and vomiting.  Endocrine: Negative for cold intolerance.  Genitourinary: Negative for decreased urine volume, difficulty urinating, dysuria, frequency, hematuria and urgency.  Musculoskeletal: Negative for arthralgias, back pain and neck pain.  Skin: Negative for rash.  Allergic/Immunologic: Negative for environmental allergies.  Neurological: Negative for dizziness, weakness, light-headedness, numbness and headaches.  Hematological: Negative for adenopathy.  Psychiatric/Behavioral: Negative for behavioral problems, dysphoric mood and sleep disturbance. The patient is not  nervous/anxious.    Per HPI unless specifically indicated above      Objective:    BP 140/80 (BP Location: Left Arm, Cuff Size: Normal)   Pulse 60   Temp (!) 97.3 F (36.3 C) (Temporal)   Resp 16   Ht 5\' 10"  (1.778 m)   Wt 222 lb 3.2 oz (100.8 kg)   BMI 31.88 kg/m   Wt Readings from Last 3 Encounters:  03/02/20 222 lb 3.2 oz (100.8 kg)  09/10/19 218 lb 9.6 oz (99.2 kg)  09/04/18 206 lb 6.4 oz (93.6 kg)    Physical Exam Vitals and nursing note reviewed.  Constitutional:      General: He is not in acute distress.    Appearance: He is well-developed. He is not diaphoretic.     Comments: Well-appearing, comfortable, cooperative  HENT:     Head: Normocephalic and atraumatic.  Eyes:     General:  Right eye: No discharge.        Left eye: No discharge.     Conjunctiva/sclera: Conjunctivae normal.     Pupils: Pupils are equal, round, and reactive to light.  Neck:     Thyroid: No thyromegaly.     Vascular: No carotid bruit.  Cardiovascular:     Rate and Rhythm: Normal rate and regular rhythm.     Heart sounds: Normal heart sounds. No murmur.  Pulmonary:     Effort: Pulmonary effort is normal. No respiratory distress.     Breath sounds: Normal breath sounds. No wheezing or rales.  Abdominal:     General: Bowel sounds are normal. There is no distension.     Palpations: Abdomen is soft. There is no mass.     Tenderness: There is no abdominal tenderness.  Musculoskeletal:        General: No tenderness. Normal range of motion.     Cervical back: Normal range of motion and neck supple.     Comments: Upper / Lower Extremities: - Normal muscle tone, strength bilateral upper extremities 5/5, lower extremities 5/5  Lymphadenopathy:     Cervical: No cervical adenopathy.  Skin:    General: Skin is warm and dry.     Findings: No erythema or rash.  Neurological:     Mental Status: He is alert and oriented to person, place, and time.     Comments: Distal sensation intact to  light touch all extremities  Psychiatric:        Behavior: Behavior normal.     Comments: Well groomed, good eye contact, normal speech and thoughts    Results for orders placed or performed in visit on 02/24/20  PSA  Result Value Ref Range   PSA 0.7 < OR = 4.0 ng/mL  Lipid panel  Result Value Ref Range   Cholesterol 210 (H) <200 mg/dL   HDL 49 > OR = 40 mg/dL   Triglycerides 122 <150 mg/dL   LDL Cholesterol (Calc) 137 (H) mg/dL (calc)   Total CHOL/HDL Ratio 4.3 <5.0 (calc)   Non-HDL Cholesterol (Calc) 161 (H) <130 mg/dL (calc)  COMPLETE METABOLIC PANEL WITH GFR  Result Value Ref Range   Glucose, Bld 114 (H) 65 - 99 mg/dL   BUN 15 7 - 25 mg/dL   Creat 1.16 0.70 - 1.25 mg/dL   GFR, Est Non African American 65 > OR = 60 mL/min/1.73m2   GFR, Est African American 75 > OR = 60 mL/min/1.89m2   BUN/Creatinine Ratio NOT APPLICABLE 6 - 22 (calc)   Sodium 139 135 - 146 mmol/L   Potassium 4.2 3.5 - 5.3 mmol/L   Chloride 106 98 - 110 mmol/L   CO2 25 20 - 32 mmol/L   Calcium 8.7 8.6 - 10.3 mg/dL   Total Protein 6.1 6.1 - 8.1 g/dL   Albumin 3.7 3.6 - 5.1 g/dL   Globulin 2.4 1.9 - 3.7 g/dL (calc)   AG Ratio 1.5 1.0 - 2.5 (calc)   Total Bilirubin 0.4 0.2 - 1.2 mg/dL   Alkaline phosphatase (APISO) 73 35 - 144 U/L   AST 10 10 - 35 U/L   ALT 10 9 - 46 U/L  CBC with Differential/Platelet  Result Value Ref Range   WBC 6.3 3.8 - 10.8 Thousand/uL   RBC 4.92 4.20 - 5.80 Million/uL   Hemoglobin 14.4 13.2 - 17.1 g/dL   HCT 42.8 38.5 - 50.0 %   MCV 87.0 80.0 - 100.0 fL   MCH 29.3  27.0 - 33.0 pg   MCHC 33.6 32.0 - 36.0 g/dL   RDW 13.0 11.0 - 15.0 %   Platelets 231 140 - 400 Thousand/uL   MPV 10.4 7.5 - 12.5 fL   Neutro Abs 3,667 1,500 - 7,800 cells/uL   Lymphs Abs 1,903 850 - 3,900 cells/uL   Absolute Monocytes 554 200 - 950 cells/uL   Eosinophils Absolute 139 15 - 500 cells/uL   Basophils Absolute 38 0 - 200 cells/uL   Neutrophils Relative % 58.2 %   Total Lymphocyte 30.2 %   Monocytes  Relative 8.8 %   Eosinophils Relative 2.2 %   Basophils Relative 0.6 %  Hemoglobin A1c  Result Value Ref Range   Hgb A1c MFr Bld 6.3 (H) <5.7 % of total Hgb   Mean Plasma Glucose 134 (calc)   eAG (mmol/L) 7.4 (calc)      Assessment & Plan:   Problem List Items Addressed This Visit    Pre-diabetes    Relatively stable PreDM at A1c 6.3 Concern with obesity, HLD  Plan:  1. Not on any therapy currently  2. Encourage improved lifestyle - low carb, low sugar diet, reduce portion size, continue improving regular exercise 3. Follow-up yearly A1c      Obesity (BMI 30.0-34.9)    Weight stable, encourage continued lifestyle improvement      Hyperlipidemia    Mixed results cholesterol, improved LDL but still elevated >130, otherwise improved results Last lipid panel 02/2020 Calculated ASCVD 10 yr risk score 15.1%  Plan: 1. Discussed ASCVD risk - offered Statin, he has declined again - Defer ASA as well, this would be other recommendation for him - Encourage improved lifestyle - low carb/cholesterol, reduce portion size, continue improving regular exercise - Yearly lipid      Elevated BP without diagnosis of hypertension - Primary    Mild elevated BP due to white coat HTN, improve on re-check not normalized - Home BP readings - none - asked if he can check more often No known complications    Plan:  1. Never on anti-HTN therapy - remain off now, no new dx 2. Encourage improved lifestyle - low sodium diet, reduce soda intake, inc water, regular exercise 3. Again advised him to start monitor BP outside office occasionally, bring readings to next visit, if persistently >140/90 or new symptoms notify office sooner  Will re-check BP at [facility] in 6 months         Updated Health Maintenance information - Again offered colon cancer screening any method. He declines. Not interested in pursuing this type of cancer screening. Advised risk of not screening. - PSA negative 0.7  (02/2020) Reviewed recent lab results with patient Encouraged improvement to lifestyle with diet and exercise - Goal of weight loss   No orders of the defined types were placed in this encounter.    Follow up plan: Return in about 1 year (around 03/02/2021) for 6 month for [name] then 1 yr for [name].  Future orders for 02/2021  Nobie Putnam, [facility] 03/02/2020, 9:14 AM

## 2020-03-02 NOTE — Assessment & Plan Note (Signed)
Weight stable, encourage continued lifestyle improvement 

## 2020-03-02 NOTE — Assessment & Plan Note (Signed)
Mild elevated BP due to white coat HTN, improve on re-check not normalized - Home BP readings - none - asked if he can check more often No known complications    Plan:  1. Never on anti-HTN therapy - remain off now, no new dx 2. Encourage improved lifestyle - low sodium diet, reduce soda intake, inc water, regular exercise 3. Again advised him to start monitor BP outside office occasionally, bring readings to next visit, if persistently >140/90 or new symptoms notify office sooner  Will re-check BP at [facility] in 6 months

## 2020-04-09 DIAGNOSIS — Z23 Encounter for immunization: Secondary | ICD-10-CM | POA: Diagnosis not present

## 2020-09-08 ENCOUNTER — Ambulatory Visit: Payer: Medicare Other

## 2020-09-15 ENCOUNTER — Ambulatory Visit (INDEPENDENT_AMBULATORY_CARE_PROVIDER_SITE_OTHER): Payer: Medicare Other

## 2020-09-15 VITALS — Ht 70.0 in | Wt 212.0 lb

## 2020-09-15 DIAGNOSIS — Z0000 Encounter for general adult medical examination without abnormal findings: Secondary | ICD-10-CM

## 2020-09-15 NOTE — Patient Instructions (Signed)
Ricky Graves , Thank you for taking time to come for your Medicare Wellness Visit. I appreciate your ongoing commitment to your health goals. Please review the following plan we discussed and let me know if I can assist you in the future.   Screening recommendations/referrals: Colonoscopy: decline Recommended yearly ophthalmology/optometry visit for glaucoma screening and checkup Recommended yearly dental visit for hygiene and checkup  Vaccinations: Influenza vaccine: due Pneumococcal vaccine: completed 08/13/2019 Tdap vaccine: completed 01/19/2017 Shingles vaccine: discussed   Covid-19:  Completed Ricky Graves and Ricky Graves not sure of date  Advanced directives: Advance directive discussed with you today. Even though you declined this today please call our office should you change your mind and we can give you the proper paperwork for you to fill out.   Conditions/risks identified: none  Next appointment: Follow up in one year for your annual wellness visit.   Preventive Care 32 Years and Older, Male Preventive care refers to lifestyle choices and visits with your health care provider that can promote health and wellness. What does preventive care include?  A yearly physical exam. This is also called an annual well check.  Dental exams once or twice a year.  Routine eye exams. Ask your health care provider how often you should have your eyes checked.  Personal lifestyle choices, including:  Daily care of your teeth and gums.  Regular physical activity.  Eating a healthy diet.  Avoiding tobacco and drug use.  Limiting alcohol use.  Practicing safe sex.  Taking low doses of aspirin every day.  Taking vitamin and mineral supplements as recommended by your health care provider. What happens during an annual well check? The services and screenings done by your health care provider during your annual well check will depend on your age, overall health, lifestyle risk factors, and  family history of disease. Counseling  Your health care provider may ask you questions about your:  Alcohol use.  Tobacco use.  Drug use.  Emotional well-being.  Home and relationship well-being.  Sexual activity.  Eating habits.  History of falls.  Memory and ability to understand (cognition).  Work and work Statistician. Screening  You may have the following tests or measurements:  Height, weight, and BMI.  Blood pressure.  Lipid and cholesterol levels. These may be checked every 5 years, or more frequently if you are over 79 years old.  Skin check.  Lung cancer screening. You may have this screening every year starting at age 70 if you have a 30-pack-year history of smoking and currently smoke or have quit within the past 15 years.  Fecal occult blood test (FOBT) of the stool. You may have this test every year starting at age 46.  Flexible sigmoidoscopy or colonoscopy. You may have a sigmoidoscopy every 5 years or a colonoscopy every 10 years starting at age 37.  Prostate cancer screening. Recommendations will vary depending on your family history and other risks.  Hepatitis C blood test.  Hepatitis B blood test.  Sexually transmitted disease (STD) testing.  Diabetes screening. This is done by checking your blood sugar (glucose) after you have not eaten for a while (fasting). You may have this done every 1-3 years.  Abdominal aortic aneurysm (AAA) screening. You may need this if you are a current or former smoker.  Osteoporosis. You may be screened starting at age 19 if you are at high risk. Talk with your health care provider about your test results, treatment options, and if necessary, the need for more tests.  Vaccines  Your health care provider may recommend certain vaccines, such as:  Influenza vaccine. This is recommended every year.  Tetanus, diphtheria, and acellular pertussis (Tdap, Td) vaccine. You may need a Td booster every 10 years.  Zoster  vaccine. You may need this after age 24.  Pneumococcal 13-valent conjugate (PCV13) vaccine. One dose is recommended after age 47.  Pneumococcal polysaccharide (PPSV23) vaccine. One dose is recommended after age 37. Talk to your health care provider about which screenings and vaccines you need and how often you need them. This information is not intended to replace advice given to you by your health care provider. Make sure you discuss any questions you have with your health care provider. Document Released: 12/04/2015 Document Revised: 07/27/2016 Document Reviewed: 09/08/2015 Elsevier Interactive Patient Education  2017 [place] Prevention in the Home Falls can cause injuries. They can happen to people of all ages. There are many things you can do to make your home safe and to help prevent falls. What can I do on the outside of my home?  Regularly fix the edges of walkways and driveways and fix any cracks.  Remove anything that might make you trip as you walk through a door, such as a raised step or threshold.  Trim any bushes or trees on the path to your home.  Use bright outdoor lighting.  Clear any walking paths of anything that might make someone trip, such as rocks or tools.  Regularly check to see if handrails are loose or broken. Make sure that both sides of any steps have handrails.  Any raised decks and porches should have guardrails on the edges.  Have any leaves, snow, or ice cleared regularly.  Use sand or salt on walking paths during winter.  Clean up any spills in your garage right away. This includes oil or grease spills. What can I do in the bathroom?  Use night lights.  Install grab bars by the toilet and in the tub and shower. Do not use towel bars as grab bars.  Use non-skid mats or decals in the tub or shower.  If you need to sit down in the shower, use a plastic, non-slip stool.  Keep the floor dry. Clean up any water that spills on the  floor as soon as it happens.  Remove soap buildup in the tub or shower regularly.  Attach bath mats securely with double-sided non-slip rug tape.  Do not have throw rugs and other things on the floor that can make you trip. What can I do in the bedroom?  Use night lights.  Make sure that you have a light by your bed that is easy to reach.  Do not use any sheets or blankets that are too big for your bed. They should not hang down onto the floor.  Have a firm chair that has side arms. You can use this for support while you get dressed.  Do not have throw rugs and other things on the floor that can make you trip. What can I do in the kitchen?  Clean up any spills right away.  Avoid walking on wet floors.  Keep items that you use a lot in easy-to-reach places.  If you need to reach something above you, use a strong step stool that has a grab bar.  Keep electrical cords out of the way.  Do not use floor polish or wax that makes floors slippery. If you must use wax, use non-skid floor wax.  Do not have throw rugs and other things on the floor that can make you trip. What can I do with my stairs?  Do not leave any items on the stairs.  Make sure that there are handrails on both sides of the stairs and use them. Fix handrails that are broken or loose. Make sure that handrails are as long as the stairways.  Check any carpeting to make sure that it is firmly attached to the stairs. Fix any carpet that is loose or worn.  Avoid having throw rugs at the top or bottom of the stairs. If you do have throw rugs, attach them to the floor with carpet tape.  Make sure that you have a light switch at the top of the stairs and the bottom of the stairs. If you do not have them, ask someone to add them for you. What else can I do to help prevent falls?  Wear shoes that:  Do not have high heels.  Have rubber bottoms.  Are comfortable and fit you well.  Are closed at the toe. Do not wear  sandals.  If you use a stepladder:  Make sure that it is fully opened. Do not climb a closed stepladder.  Make sure that both sides of the stepladder are locked into place.  Ask someone to hold it for you, if possible.  Clearly mark and make sure that you can see:  Any grab bars or handrails.  First and last steps.  Where the edge of each step is.  Use tools that help you move around (mobility aids) if they are needed. These include:  Canes.  Walkers.  Scooters.  Crutches.  Turn on the lights when you go into a dark area. Replace any light bulbs as soon as they burn out.  Set up your furniture so you have a clear path. Avoid moving your furniture around.  If any of your floors are uneven, fix them.  If there are any pets around you, be aware of where they are.  Review your medicines with your doctor. Some medicines can make you feel dizzy. This can increase your chance of falling. Ask your doctor what other things that you can do to help prevent falls. This information is not intended to replace advice given to you by your health care provider. Make sure you discuss any questions you have with your health care provider. Document Released: 09/03/2009 Document Revised: 04/14/2016 Document Reviewed: 12/12/2014 Elsevier Interactive Patient Education  2017 [employer].

## 2020-09-15 NOTE — Progress Notes (Signed)
I connected with Ricky Graves today by telephone and verified that I am speaking with the correct person using two identifiers. Location patient: home Location provider: work Persons participating in the virtual visit: Jahree Dermody, Glenna Durand LPN.   I discussed the limitations, risks, security and privacy concerns of performing an evaluation and management service by telephone and the availability of in person appointments. I also discussed with the patient that there may be a patient responsible charge related to this service. The patient expressed understanding and verbally consented to this telephonic visit.    Interactive audio and video telecommunications were attempted between this provider and patient, however failed, due to patient having technical difficulties OR patient did not have access to video capability.  We continued and completed visit with audio only.     Vital signs may be patient reported or missing.  Subjective:   Ricky Graves is a 69 y.o. male who presents for Medicare Annual/Subsequent preventive examination.  Review of Systems     Cardiac Risk Factors include: advanced age (>71men, >19 women);obesity (BMI >30kg/m2)     Objective:    Today's Vitals   09/15/20 1130  Weight: 212 lb (96.2 kg)  Height: 5\' 10"  (1.778 m)   Body mass index is 30.42 kg/m.  Advanced Directives 09/15/2020 09/10/2019 09/04/2018 08/23/2017  Does Patient Have a Medical Advance Directive? No No No No  Does patient want to make changes to medical advance directive? - - - Yes (MAU/Ambulatory/Procedural Areas - Information given)  Would patient like information on creating a medical advance directive? - Yes (MAU/Ambulatory/Procedural Areas - Information given) No - Patient declined -    Current Medications (verified) Outpatient Encounter Medications as of 09/15/2020  Medication Sig  . acetaminophen (TYLENOL) 325 MG tablet Take 650 mg by mouth every 6 (six) hours as needed.  Marland Kitchen  ibuprofen (ADVIL) 400 MG tablet Take 400 mg by mouth every 6 (six) hours as needed. As needed, alternates with tyl  . pseudoephedrine (SUDAFED) 30 MG tablet Take 30 mg by mouth every 4 (four) hours as needed for congestion. As needed   No facility-administered encounter medications on file as of 09/15/2020.    Allergies (verified) Benadryl [diphenhydramine]   History: Past Medical History:  Diagnosis Date  . Adult acne   . Cancer of the skin, basal cell    Past Surgical History:  Procedure Laterality Date  . TONSILLECTOMY    . VASECTOMY  1980   Family History  Problem Relation Age of Onset  . Heart disease Father   . Stroke Father   . Diabetes Father   . Diabetes Paternal Grandmother    Social History   Socioeconomic History  . Marital status: Married    Spouse name: Not on file  . Number of children: Not on file  . Years of education: Vocational  . Highest education level: Some college, no degree  Occupational History  . 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 Smoking status: Former Smoker    Packs/day: 1.50    Years: 30.00    Pack years: 45.00    Types: Cigarettes    Quit date: 05/15/2008    Years since quitting: 12.3  . Smokeless tobacco: Never Used  Vaping Use  . Vaping Use: Never used  Substance and Sexual Activity  . Alcohol use: Yes    Comment: occasioanlly  . Drug use: No  . Sexual activity: Not on file  Other Topics  Concern  . Not on file  Social History Narrative  . Not on file   Social Determinants of Health   Financial Resource Strain: Low Risk   . Difficulty of Paying Living Expenses: Not hard at all  Food Insecurity: No Food Insecurity  . Worried About Charity fundraiser in the Last Year: Never true  . Ran Out of Food in the Last Year: Never true  Transportation Needs: No Transportation Needs  . Lack of Transportation (Medical): No  . Lack of Transportation (Non-Medical): No  Physical  Activity: Inactive  . Days of Exercise per Week: 0 days  . Minutes of Exercise per Session: 0 min  Stress: No Stress Concern Present  . Feeling of Stress : Not at all  Social Connections:   . Frequency of Communication with Friends and Family: Not on file  . Frequency of Social Gatherings with Friends and Family: Not on file  . Attends Religious Services: Not on file  . Active Member of Clubs or Organizations: Not on file  . Attends Archivist Meetings: Not on file  . Marital Status: Not on file    Tobacco Counseling Counseling given: Not Answered   Clinical Intake:  Pre-visit preparation completed: Yes  Pain : No/denies pain     Nutritional Status: BMI > 30  Obese Nutritional Risks: None Diabetes: No  How often do you need to have someone help you when you read instructions, pamphlets, or other written materials from your doctor or pharmacy?: 1 - Never What is the last grade level you completed in school?: some college  Diabetic?no  Interpreter Needed?: No  Information entered by :: NAllen LPN   Activities of Daily Living In your present state of health, do you have any difficulty performing the following activities: 09/15/2020  Hearing? Y  Vision? N  Difficulty concentrating or making decisions? N  Walking or climbing stairs? N  Dressing or bathing? N  Doing errands, shopping? N  Preparing Food and eating ? N  Using the Toilet? N  In the past six months, have you accidently leaked urine? N  Do you have problems with loss of bowel control? N  Managing your Medications? N  Managing your Finances? N  Housekeeping or managing your Housekeeping? N  Some recent data might be hidden    Patient Care Team: Olin Hauser, DO as PCP - General (Family Medicine)  Indicate any recent Medical Services you may have received from other than [facility] providers in the past year (date may be approximate).     Assessment:   This is a routine wellness  examination for [name].  Hearing/Vision screen  Hearing Screening   125Hz  250Hz  500Hz  1000Hz  2000Hz  3000Hz  4000Hz  6000Hz  8000Hz   Right ear:           Left ear:           Vision Screening Comments: No regular eye exams  Dietary issues and exercise activities discussed: Current Exercise Habits: The patient does not participate in regular exercise at present  Goals    . DIET - INCREASE WATER INTAKE     Recommend drinking at least 6-8 glasses of water a day     . Patient Stated     09/15/2020, wants to get under 200 pounds    . Weight (lb) < 200 lb (90.7 kg)     Wants to be at 165 lbs quit smoking made weight gain 221 lbs      Depression Screen PHQ  2/9 Scores 09/15/2020 03/02/2020 09/10/2019 03/14/2019 03/04/2019 09/04/2018 02/28/2018  PHQ - 2 Score 0 0 0 0 0 0 0  PHQ- 9 Score - - - - - - -    Fall Risk Fall Risk  09/15/2020 03/02/2020 09/10/2019 03/14/2019 03/04/2019  Falls in the past year? 0 0 0 0 0  Number falls in past yr: - 0 0 - -  Injury with Fall? - 0 0 - -  Risk for fall due to : No Fall Risks - - - -  Follow up Falls evaluation completed;Education provided;Falls prevention discussed Falls evaluation completed - Falls evaluation completed Falls evaluation completed    Any stairs in or around the home? Yes  If so, are there any without handrails? No  Home free of loose throw rugs in walkways, pet beds, electrical cords, etc? Yes  Adequate lighting in your home to reduce risk of falls? Yes   ASSISTIVE DEVICES UTILIZED TO PREVENT FALLS:  Life alert? No  Use of a cane, walker or w/c? No  Grab bars in the bathroom? No  Shower chair or bench in shower? Yes  Elevated toilet seat or a handicapped toilet? Yes   TIMED UP AND GO:  Was the test performed? No   Cognitive Function: MMSE - Mini Mental State Exam 08/23/2017  Orientation to time 5  Orientation to Place 5  Registration 3  Attention/ Calculation 5  Recall 3  Language- name 2 objects 2  Language- repeat 1    Language- follow 3 step command 3  Language- read & follow direction 1  Write a sentence 1  Copy design 1  Total score 30     6CIT Screen 09/15/2020 09/10/2019 09/04/2018  What Year? 0 points 0 points 0 points  What month? 0 points 0 points 0 points  What time? 0 points 0 points 0 points  Count back from 20 0 points 0 points 0 points  Months in reverse 0 points 0 points 0 points  Repeat phrase 0 points 0 points 0 points  Total Score 0 0 0    Immunizations Immunization History  Administered Date(s) Administered  . Influenza, High Dose Seasonal PF 08/23/2017, 08/13/2019  . Influenza-Unspecified 08/21/2018  . Pneumococcal Conjugate-13 09/04/2018  . Pneumococcal Polysaccharide-23 08/13/2019    TDAP status: Up to date Flu Vaccine status: due Pneumococcal vaccine status: Up to date Covid-19 vaccine status: Completed vaccines  Qualifies for Shingles Vaccine? Yes   Zostavax completed No   Shingrix Completed?: No.    Education has been provided regarding the importance of this vaccine. Patient has been advised to call insurance company to determine out of pocket expense if they have not yet received this vaccine. Advised may also receive vaccine at local pharmacy or Health Dept. Verbalized acceptance and understanding.  Screening Tests Health Maintenance  Topic Date Due  . Hepatitis C Screening  Never done  . COVID-19 Vaccine (1) Never done  . COLONOSCOPY  Never done  . INFLUENZA VACCINE  06/21/2020  . TETANUS/TDAP  01/20/2027  . PNA vac Low Risk Adult  Completed    Health Maintenance  Health Maintenance Due  Topic Date Due  . Hepatitis C Screening  Never done  . COVID-19 Vaccine (1) Never done  . COLONOSCOPY  Never done  . INFLUENZA VACCINE  06/21/2020    Colorectal cancer screening: decline  Lung Cancer Screening: (Low Dose CT Chest recommended if Age 33-80 years, 30 pack-year currently smoking OR have quit w/in 15years.) does not qualify.  Lung Cancer  Screening Referral: no  Additional Screening:  Hepatitis C Screening: does qualify;   Vision Screening: Recommended annual ophthalmology exams for early detection of glaucoma and other disorders of the eye. Is the patient up to date with their annual eye exam?  No  Who is the provider or what is the name of the office in which the patient attends annual eye exams? none If pt is not established with a provider, would they like to be referred to a provider to establish care? No .   Dental Screening: Recommended annual dental exams for proper oral hygiene  Community Resource Referral / Chronic Care Management: CRR required this visit?  No   CCM required this visit?  No      Plan:     I have personally reviewed and noted the following in the patient's chart:   . Medical and social history . Use of alcohol, tobacco or illicit drugs  . Current medications and supplements . Functional ability and status . Nutritional status . Physical activity . Advanced directives . List of other physicians . Hospitalizations, surgeries, and ER visits in previous 12 months . Vitals . Screenings to include cognitive, depression, and falls . Referrals and appointments  In addition, I have reviewed and discussed with patient certain preventive protocols, quality metrics, and best practice recommendations. A written personalized care plan for preventive services as well as general preventive health recommendations were provided to patient.     Kellie Simmering, LPN   [id number]   Nurse Notes:

## 2020-09-25 DIAGNOSIS — Z23 Encounter for immunization: Secondary | ICD-10-CM | POA: Diagnosis not present

## 2021-03-01 ENCOUNTER — Other Ambulatory Visit: Payer: Self-pay

## 2021-03-01 ENCOUNTER — Other Ambulatory Visit: Payer: Medicare Other

## 2021-03-01 DIAGNOSIS — E782 Mixed hyperlipidemia: Secondary | ICD-10-CM | POA: Diagnosis not present

## 2021-03-01 DIAGNOSIS — R030 Elevated blood-pressure reading, without diagnosis of hypertension: Secondary | ICD-10-CM

## 2021-03-01 DIAGNOSIS — E669 Obesity, unspecified: Secondary | ICD-10-CM | POA: Diagnosis not present

## 2021-03-01 DIAGNOSIS — R7303 Prediabetes: Secondary | ICD-10-CM | POA: Diagnosis not present

## 2021-03-01 DIAGNOSIS — R351 Nocturia: Secondary | ICD-10-CM | POA: Diagnosis not present

## 2021-03-02 LAB — HEMOGLOBIN A1C
Hgb A1c MFr Bld: 6.3 % of total Hgb — ABNORMAL HIGH (ref <5.7)
Mean Plasma Glucose: 134 mg/dL
eAG (mmol/L): 7.4 mmol/L

## 2021-03-02 LAB — COMPLETE METABOLIC PANEL WITH GFR
AG Ratio: 1.7 (calc) (ref 1.0–2.5)
ALT: 12 U/L (ref 9–46)
AST: 12 U/L (ref 10–35)
Albumin: 3.8 g/dL (ref 3.6–5.1)
Alkaline phosphatase (APISO): 81 U/L (ref 35–144)
BUN: 15 mg/dL (ref 7–25)
CO2: 27 mmol/L (ref 20–32)
Calcium: 8.9 mg/dL (ref 8.6–10.3)
Chloride: 104 mmol/L (ref 98–110)
Creat: 1.15 mg/dL (ref 0.70–1.25)
GFR, Est African American: 75 mL/min/{1.73_m2} (ref >=60)
GFR, Est Non African American: 65 mL/min/{1.73_m2} (ref >=60)
Globulin: 2.3 g/dL (calc) (ref 1.9–3.7)
Glucose, Bld: 107 mg/dL — ABNORMAL HIGH (ref 65–99)
Potassium: 4 mmol/L (ref 3.5–5.3)
Sodium: 139 mmol/L (ref 135–146)
Total Bilirubin: 0.5 mg/dL (ref 0.2–1.2)
Total Protein: 6.1 g/dL (ref 6.1–8.1)

## 2021-03-02 LAB — CBC WITH DIFFERENTIAL/PLATELET
Absolute Monocytes: 602 cells/uL (ref 200–950)
Basophils Absolute: 32 cells/uL (ref 0–200)
Basophils Relative: 0.5 %
Eosinophils Absolute: 122 cells/uL (ref 15–500)
Eosinophils Relative: 1.9 %
HCT: 42.1 % (ref 38.5–50.0)
Hemoglobin: 14.2 g/dL (ref 13.2–17.1)
Lymphs Abs: 1869 cells/uL (ref 850–3900)
MCH: 29.7 pg (ref 27.0–33.0)
MCHC: 33.7 g/dL (ref 32.0–36.0)
MCV: 88.1 fL (ref 80.0–100.0)
MPV: 10.4 fL (ref 7.5–12.5)
Monocytes Relative: 9.4 %
Neutro Abs: 3776 cells/uL (ref 1500–7800)
Neutrophils Relative %: 59 %
Platelets: 216 10*3/uL (ref 140–400)
RBC: 4.78 10*6/uL (ref 4.20–5.80)
RDW: 13.2 % (ref 11.0–15.0)
Total Lymphocyte: 29.2 %
WBC: 6.4 10*3/uL (ref 3.8–10.8)

## 2021-03-02 LAB — PSA: PSA: 1.81 ng/mL (ref <=4.0)

## 2021-03-02 LAB — LIPID PANEL
Cholesterol: 222 mg/dL — ABNORMAL HIGH (ref <200)
HDL: 47 mg/dL (ref >=40)
LDL Cholesterol (Calc): 153 mg/dL (calc) — ABNORMAL HIGH
Non-HDL Cholesterol (Calc): 175 mg/dL (calc) — ABNORMAL HIGH (ref <130)
Total CHOL/HDL Ratio: 4.7 (calc) (ref <5.0)
Triglycerides: 107 mg/dL (ref <150)

## 2021-03-05 ENCOUNTER — Encounter: Payer: Self-pay | Admitting: Family Medicine

## 2021-03-05 ENCOUNTER — Other Ambulatory Visit: Payer: Self-pay

## 2021-03-05 ENCOUNTER — Ambulatory Visit (INDEPENDENT_AMBULATORY_CARE_PROVIDER_SITE_OTHER): Payer: Medicare Other | Admitting: Family Medicine

## 2021-03-05 ENCOUNTER — Other Ambulatory Visit: Payer: Self-pay | Admitting: Family Medicine

## 2021-03-05 VITALS — BP 138/80 | HR 72 | Ht 67.0 in | Wt 221.2 lb

## 2021-03-05 DIAGNOSIS — E782 Mixed hyperlipidemia: Secondary | ICD-10-CM

## 2021-03-05 DIAGNOSIS — R7303 Prediabetes: Secondary | ICD-10-CM

## 2021-03-05 DIAGNOSIS — R030 Elevated blood-pressure reading, without diagnosis of hypertension: Secondary | ICD-10-CM

## 2021-03-05 DIAGNOSIS — J3089 Other allergic rhinitis: Secondary | ICD-10-CM | POA: Diagnosis not present

## 2021-03-05 DIAGNOSIS — E669 Obesity, unspecified: Secondary | ICD-10-CM

## 2021-03-05 DIAGNOSIS — R351 Nocturia: Secondary | ICD-10-CM

## 2021-03-05 NOTE — Assessment & Plan Note (Signed)
Weight stable, encourage continued lifestyle improvement

## 2021-03-05 NOTE — Progress Notes (Signed)
Subjective:    Patient ID: Ricky Graves, male    DOB: 1951-01-20, 70 y.o.   MRN: [id number]  Ricky Graves is a 70 y.o. male presenting on 03/05/2021 for Annual Exam, Hyperlipidemia, and Prediabetes   HPI   Pre-Diabetes Prior trend A1c 6.2 to 6.3. Last lab 6.3 (02/2021) He has improved lifestyle overall but lately less active due to rainy weather CBGs:Not checking CBG Meds:Never on meds Currentlynot onACEi / ARB Lifestyle: Weight stable from last. - Diet: Improved. drinking more water. - Exercise:He has been doing more household chores with his wife having her injury, he is trying to work on improving exercise again. Denies hypoglycemia, numbness tingling weakness  Elevated BP without diagnosis of HTN Not checking BP outside office. History of "white coat syndrome". Usually BP improves on re-check. Current Meds -None (never on) Denies CP, dyspnea, HA, edema, dizziness / lightheadedness  HYPERLIPIDEMIA: - Reports no concerns. Last lipid panel 02/2021,mixed results with normal HDL and TG and has elevated LDL >150, total cholesterol >220 - Never on cholesterol medicine, he isstill notinterested in Statin. He prefers to improve lifestyle  PMH -History of Environmental / Seasonal Allergies,History of Basal Cell Carcinoma   Health Maintenance: - Colon Cancer Screening: Never had colonoscopy. He has done FOBT negative (01/2017). No known family history of colon cancer.DUE for colon CA screening. Offered again with options FOBT, Cologuard, Colonoscopy - emphasized importance of screening, he is not worried because he has no family history. Encouraged him to reconsider.  -Prostate Cancer Screening: Last PSA1.83 (4/22) previously 0.7 to 0.8 past few years, now inc by 1.0. Past DRE reportedly normal. No family or personal history of prostate cancer. He is asymptomatic without LUTS.   Depression screen [facility] 2/9 09/15/2020 03/02/2020 09/10/2019  Decreased Interest 0 0  0  Down, Depressed, Hopeless 0 0 0  PHQ - 2 Score 0 0 0  Altered sleeping - - -  Tired, decreased energy - - -  Change in appetite - - -  Feeling bad or failure about yourself  - - -  Trouble concentrating - - -  Moving slowly or fidgety/restless - - -  Suicidal thoughts - - -  PHQ-9 Score - - -    Past Medical History:  Diagnosis Date  . Adult acne   . Cancer of the skin, basal cell    Past Surgical History:  Procedure Laterality Date  . TONSILLECTOMY    . VASECTOMY  1980   Social History   Socioeconomic History  . Marital status: Married    Spouse name: Not on file  . Number of children: Not on file  . Years of education: Vocational  . Highest education level: Some college, no degree  Occupational History  . 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 Smoking status: Former Smoker    Packs/day: 1.50    Years: 30.00    Pack years: 45.00    Types: Cigarettes    Quit date: 05/15/2008    Years since quitting: 12.8  . Smokeless tobacco: Never Used  Vaping Use  . Vaping Use: Never used  Substance and Sexual Activity  . Alcohol use: Yes    Comment: occasioanlly  . Drug use: No  . Sexual activity: Not on file  Other Topics Concern  . Not on file  Social History Narrative  . Not on file   Social Determinants of Health   Financial Resource Strain: Low Risk   .  Difficulty of Paying Living Expenses: Not hard at all  Food Insecurity: No Food Insecurity  . Worried About Charity fundraiser in the Last Year: Never true  . Ran Out of Food in the Last Year: Never true  Transportation Needs: No Transportation Needs  . Lack of Transportation (Medical): No  . Lack of Transportation (Non-Medical): No  Physical Activity: Inactive  . Days of Exercise per Week: 0 days  . Minutes of Exercise per Session: 0 min  Stress: No Stress Concern Present  . Feeling of Stress : Not at all  Social Connections: Not on file  Intimate  Partner Violence: Not on file   Family History  Problem Relation Age of Onset  . Heart disease Father   . Stroke Father   . Diabetes Father   . Diabetes Paternal Grandmother    Current Outpatient Medications on File Prior to Visit  Medication Sig  . acetaminophen (TYLENOL) 325 MG tablet Take 650 mg by mouth every 6 (six) hours as needed.  Marland Kitchen ibuprofen (ADVIL) 400 MG tablet Take 400 mg by mouth every 6 (six) hours as needed. As needed, alternates with tyl  . pseudoephedrine (SUDAFED) 30 MG tablet Take 30 mg by mouth every 4 (four) hours as needed for congestion. As needed   No current facility-administered medications on file prior to visit.    Review of Systems  Constitutional: Negative for activity change, appetite change, chills, diaphoresis, fatigue and fever.  HENT: Negative for congestion and hearing loss.   Eyes: Negative for visual disturbance.  Respiratory: Negative for cough, chest tightness, shortness of breath and wheezing.   Cardiovascular: Negative for chest pain, palpitations and leg swelling.  Gastrointestinal: Negative for abdominal pain, constipation, diarrhea, nausea and vomiting.  Endocrine: Negative for cold intolerance.  Genitourinary: Negative for dysuria, frequency and hematuria.  Musculoskeletal: Negative for arthralgias and neck pain.  Skin: Negative for rash.  Allergic/Immunologic: Negative for environmental allergies.  Neurological: Negative for dizziness, weakness, light-headedness, numbness and headaches.  Hematological: Negative for adenopathy.  Psychiatric/Behavioral: Negative for behavioral problems, dysphoric mood and sleep disturbance.   Per HPI unless specifically indicated above      Objective:    BP 138/80 (BP Location: Left Arm, Cuff Size: Normal)   Pulse 72   Ht 5\' 7"  (1.702 m)   Wt 221 lb 3.2 oz (100.3 kg)   SpO2 98%   BMI 34.64 kg/m   Wt Readings from Last 3 Encounters:  03/05/21 221 lb 3.2 oz (100.3 kg)  09/15/20 212 lb (96.2  kg)  03/02/20 222 lb 3.2 oz (100.8 kg)    Physical Exam Vitals and nursing note reviewed.  Constitutional:      General: He is not in acute distress.    Appearance: He is well-developed. He is not diaphoretic.     Comments: Well-appearing, comfortable, cooperative  HENT:     Head: Normocephalic and atraumatic.  Eyes:     General:        Right eye: No discharge.        Left eye: No discharge.     Conjunctiva/sclera: Conjunctivae normal.     Pupils: Pupils are equal, round, and reactive to light.  Neck:     Thyroid: No thyromegaly.     Vascular: No carotid bruit.  Cardiovascular:     Rate and Rhythm: Normal rate and regular rhythm.     Heart sounds: Normal heart sounds. No murmur heard.   Pulmonary:     Effort: Pulmonary effort is  normal. No respiratory distress.     Breath sounds: Normal breath sounds. No wheezing or rales.  Abdominal:     General: Bowel sounds are normal. There is no distension.     Palpations: Abdomen is soft. There is no mass.     Tenderness: There is no abdominal tenderness.  Musculoskeletal:        General: No tenderness. Normal range of motion.     Cervical back: Normal range of motion and neck supple.     Right lower leg: No edema.     Left lower leg: No edema.     Comments: Upper / Lower Extremities: - Normal muscle tone, strength bilateral upper extremities 5/5, lower extremities 5/5  Lymphadenopathy:     Cervical: No cervical adenopathy.  Skin:    General: Skin is warm and dry.     Findings: No erythema or rash.  Neurological:     Mental Status: He is alert and oriented to person, place, and time.     Comments: Distal sensation intact to light touch all extremities  Psychiatric:        Behavior: Behavior normal.     Comments: Well groomed, good eye contact, normal speech and thoughts       Results for orders placed or performed in visit on 03/01/21  PSA  Result Value Ref Range   PSA 1.81 < OR = 4.0 ng/mL  Lipid panel  Result Value  Ref Range   Cholesterol 222 (H) <200 mg/dL   HDL 47 > OR = 40 mg/dL   Triglycerides 107 <150 mg/dL   LDL Cholesterol (Calc) 153 (H) mg/dL (calc)   Total CHOL/HDL Ratio 4.7 <5.0 (calc)   Non-HDL Cholesterol (Calc) 175 (H) <130 mg/dL (calc)  COMPLETE METABOLIC PANEL WITH GFR  Result Value Ref Range   Glucose, Bld 107 (H) 65 - 99 mg/dL   BUN 15 7 - 25 mg/dL   Creat 1.15 0.70 - 1.25 mg/dL   GFR, Est Non African American 65 > OR = 60 mL/min/1.9m2   GFR, Est African American 75 > OR = 60 mL/min/1.63m2   BUN/Creatinine Ratio NOT APPLICABLE 6 - 22 (calc)   Sodium 139 135 - 146 mmol/L   Potassium 4.0 3.5 - 5.3 mmol/L   Chloride 104 98 - 110 mmol/L   CO2 27 20 - 32 mmol/L   Calcium 8.9 8.6 - 10.3 mg/dL   Total Protein 6.1 6.1 - 8.1 g/dL   Albumin 3.8 3.6 - 5.1 g/dL   Globulin 2.3 1.9 - 3.7 g/dL (calc)   AG Ratio 1.7 1.0 - 2.5 (calc)   Total Bilirubin 0.5 0.2 - 1.2 mg/dL   Alkaline phosphatase (APISO) 81 35 - 144 U/L   AST 12 10 - 35 U/L   ALT 12 9 - 46 U/L  CBC with Differential/Platelet  Result Value Ref Range   WBC 6.4 3.8 - 10.8 Thousand/uL   RBC 4.78 4.20 - 5.80 Million/uL   Hemoglobin 14.2 13.2 - 17.1 g/dL   HCT 42.1 38.5 - 50.0 %   MCV 88.1 80.0 - 100.0 fL   MCH 29.7 27.0 - 33.0 pg   MCHC 33.7 32.0 - 36.0 g/dL   RDW 13.2 11.0 - 15.0 %   Platelets 216 140 - 400 Thousand/uL   MPV 10.4 7.5 - 12.5 fL   Neutro Abs 3,776 1,500 - 7,800 cells/uL   Lymphs Abs 1,869 850 - 3,900 cells/uL   Absolute Monocytes 602 200 - 950 cells/uL   Eosinophils  Absolute 122 15 - 500 cells/uL   Basophils Absolute 32 0 - 200 cells/uL   Neutrophils Relative % 59 %   Total Lymphocyte 29.2 %   Monocytes Relative 9.4 %   Eosinophils Relative 1.9 %   Basophils Relative 0.5 %  Hemoglobin A1c  Result Value Ref Range   Hgb A1c MFr Bld 6.3 (H) <5.7 % of total Hgb   Mean Plasma Glucose 134 mg/dL   eAG (mmol/L) 7.4 mmol/L      Assessment & Plan:   Problem List Items Addressed This Visit     Pre-diabetes - Primary    Relatively stable PreDM at A1c 6.3., has not changed from past few years Concern with obesity, HLD  Plan:  1. Not on any therapy currently  2. Encourage improved lifestyle - low carb, low sugar diet, reduce portion size, continue improving regular exercise 3. Follow-up yearly A1c      Obesity (BMI 30.0-34.9)    Weight stable, encourage continued lifestyle improvement      Hyperlipidemia    Mixed results cholesterol, improved LDL but still elevated >150, otherwise improved results Last lipid panel 02/2021 Calculated ASCVD 10 yr risk score 15.1%  Plan: 1. Discussed ASCVD risk - offered Statin, he has declined again - Defer ASA as well, this would be other recommendation for him - Encourage improved lifestyle - low carb/cholesterol, reduce portion size, continue improving regular exercise - Yearly lipid      Environmental and seasonal allergies   Elevated BP without diagnosis of hypertension    Mild elevated BP due to white coat HTN, improve on re-check has normalized - Home BP readings - none - asked if he can check more often No known complications    Plan:  1. Never on anti-HTN therapy - remain off now, no new dx 2. Encourage improved lifestyle - low sodium diet, reduce soda intake, inc water, regular exercise 3. Again advised him to start monitor BP outside office occasionally, bring readings to next visit, if persistently >140/90 or new symptoms notify office sooner         Updated Health Maintenance information - Again offered colon cancer screening any method. He declines. Not interested in pursuing this type of cancer screening. Advised risk of not screening. - PSA negative 1.8 (02/2021) Reviewed recent lab results with patient Encouraged improvement to lifestyle with diet and exercise - Goal of weight loss   No orders of the defined types were placed in this encounter.     Follow up plan: Return in about 1 year (around 03/05/2022)  for 1 year fasting lab only then 1 week later [facility].   Future labs medicare 02/2022  Nobie Putnam, [facility] 03/05/2021, 10:50 AM

## 2021-03-05 NOTE — Patient Instructions (Addendum)
Thank you for coming to the office today.   1. Chemistry - Normal results, including electrolytes, kidney and liver function.  Elevated fasting blood sugar  2. Hemoglobin A1c (Diabetes screening) - 6.3, in range of Pre-Diabetes (>5.7 to 6.4) similar to prior range 6.2 to 6.3  3. PSA Prostate Cancer Screening - 1.81 - increased PSA from 0.7-0.8 range but still negative.  4. Cholesterol - Abnormal cholesterol results. Elevated LDL at 153, based on cardiovascular risk score would benefit from statin therapy.  5. CBC Blood Counts - Normal, no anemia, other abnormality   Please schedule a Follow-up Appointment to: Return in about 1 year (around 03/05/2022) for 1 year fasting lab only then 1 week later [name].  If you have any other questions or concerns, please feel free to call the office or send a message through [place]. You may also schedule an earlier appointment if necessary.  Additionally, you may be receiving a survey about your experience at our office within a few days to 1 week by e-mail or mail. We value your feedback.  Nobie Putnam, DO [place]

## 2021-03-05 NOTE — Assessment & Plan Note (Signed)
Mild elevated BP due to white coat HTN, improve on re-check has normalized - Home BP readings - none - asked if he can check more often No known complications    Plan:  1. Never on anti-HTN therapy - remain off now, no new dx 2. Encourage improved lifestyle - low sodium diet, reduce soda intake, inc water, regular exercise 3. Again advised him to start monitor BP outside office occasionally, bring readings to next visit, if persistently >140/90 or new symptoms notify office sooner

## 2021-03-05 NOTE — Assessment & Plan Note (Signed)
Mixed results cholesterol, improved LDL but still elevated >150, otherwise improved results Last lipid panel 02/2021 Calculated ASCVD 10 yr risk score 15.1%  Plan: 1. Discussed ASCVD risk - offered Statin, he has declined again - Defer ASA as well, this would be other recommendation for him - Encourage improved lifestyle - low carb/cholesterol, reduce portion size, continue improving regular exercise - Yearly lipid

## 2021-03-05 NOTE — Assessment & Plan Note (Signed)
Relatively stable PreDM at A1c 6.3., has not changed from past few years Concern with obesity, HLD  Plan:  1. Not on any therapy currently  2. Encourage improved lifestyle - low carb, low sugar diet, reduce portion size, continue improving regular exercise 3. Follow-up yearly A1c

## 2021-09-16 ENCOUNTER — Ambulatory Visit: Payer: Medicare Other

## 2021-09-21 ENCOUNTER — Ambulatory Visit (INDEPENDENT_AMBULATORY_CARE_PROVIDER_SITE_OTHER): Payer: Medicare Other

## 2021-09-21 VITALS — Ht 70.0 in | Wt 213.0 lb

## 2021-09-21 DIAGNOSIS — Z0000 Encounter for general adult medical examination without abnormal findings: Secondary | ICD-10-CM | POA: Diagnosis not present

## 2021-09-21 NOTE — Patient Instructions (Signed)
[name] , Thank you for taking time to come for your Medicare Wellness Visit. I appreciate your ongoing commitment to your health goals. Please review the following plan we discussed and let me know if I can assist you in the future.   Screening recommendations/referrals: Colonoscopy: decline Recommended yearly ophthalmology/optometry visit for glaucoma screening and checkup Recommended yearly dental visit for hygiene and checkup  Vaccinations: Influenza vaccine: due Pneumococcal vaccine: completed 08/13/2019 Tdap vaccine: 01/19/2017, due 01/20/2027 Shingles vaccine: discussed   Covid-19:  04/09/2020  Advanced directives: Advance directive discussed with you today.   Conditions/risks identified: none  Next appointment: Follow up in one year for your annual wellness visit.   Preventive Care 69 Years and Older, Male Preventive care refers to lifestyle choices and visits with your health care provider that can promote health and wellness. What does preventive care include? A yearly physical exam. This is also called an annual well check. Dental exams once or twice a year. Routine eye exams. Ask your health care provider how often you should have your eyes checked. Personal lifestyle choices, including: Daily care of your teeth and gums. Regular physical activity. Eating a healthy diet. Avoiding tobacco and drug use. Limiting alcohol use. Practicing safe sex. Taking low doses of aspirin every day. Taking vitamin and mineral supplements as recommended by your health care provider. What happens during an annual well check? The services and screenings done by your health care provider during your annual well check will depend on your age, overall health, lifestyle risk factors, and family history of disease. Counseling  Your health care provider may ask you questions about your: Alcohol use. Tobacco use. Drug use. Emotional well-being. Home and relationship well-being. Sexual  activity. Eating habits. History of falls. Memory and ability to understand (cognition). Work and work Statistician. Screening  You may have the following tests or measurements: Height, weight, and BMI. Blood pressure. Lipid and cholesterol levels. These may be checked every 5 years, or more frequently if you are over 49 years old. Skin check. Lung cancer screening. You may have this screening every year starting at age 42 if you have a 30-pack-year history of smoking and currently smoke or have quit within the past 15 years. Fecal occult blood test (FOBT) of the stool. You may have this test every year starting at age 65. Flexible sigmoidoscopy or colonoscopy. You may have a sigmoidoscopy every 5 years or a colonoscopy every 10 years starting at age 94. Prostate cancer screening. Recommendations will vary depending on your family history and other risks. Hepatitis C blood test. Hepatitis B blood test. Sexually transmitted disease (STD) testing. Diabetes screening. This is done by checking your blood sugar (glucose) after you have not eaten for a while (fasting). You may have this done every 1-3 years. Abdominal aortic aneurysm (AAA) screening. You may need this if you are a current or former smoker. Osteoporosis. You may be screened starting at age 64 if you are at high risk. Talk with your health care provider about your test results, treatment options, and if necessary, the need for more tests. Vaccines  Your health care provider may recommend certain vaccines, such as: Influenza vaccine. This is recommended every year. Tetanus, diphtheria, and acellular pertussis (Tdap, Td) vaccine. You may need a Td booster every 10 years. Zoster vaccine. You may need this after age 68. Pneumococcal 13-valent conjugate (PCV13) vaccine. One dose is recommended after age 16. Pneumococcal polysaccharide (PPSV23) vaccine. One dose is recommended after age 13. Talk to  your health care provider about which  screenings and vaccines you need and how often you need them. This information is not intended to replace advice given to you by your health care provider. Make sure you discuss any questions you have with your health care provider. Document Released: 12/04/2015 Document Revised: 07/27/2016 Document Reviewed: 09/08/2015 Elsevier Interactive Patient Education  2017 [place] Prevention in the Home Falls can cause injuries. They can happen to people of all ages. There are many things you can do to make your home safe and to help prevent falls. What can I do on the outside of my home? Regularly fix the edges of walkways and driveways and fix any cracks. Remove anything that might make you trip as you walk through a door, such as a raised step or threshold. Trim any bushes or trees on the path to your home. Use bright outdoor lighting. Clear any walking paths of anything that might make someone trip, such as rocks or tools. Regularly check to see if handrails are loose or broken. Make sure that both sides of any steps have handrails. Any raised decks and porches should have guardrails on the edges. Have any leaves, snow, or ice cleared regularly. Use sand or salt on walking paths during winter. Clean up any spills in your garage right away. This includes oil or grease spills. What can I do in the bathroom? Use night lights. Install grab bars by the toilet and in the tub and shower. Do not use towel bars as grab bars. Use non-skid mats or decals in the tub or shower. If you need to sit down in the shower, use a plastic, non-slip stool. Keep the floor dry. Clean up any water that spills on the floor as soon as it happens. Remove soap buildup in the tub or shower regularly. Attach bath mats securely with double-sided non-slip rug tape. Do not have throw rugs and other things on the floor that can make you trip. What can I do in the bedroom? Use night lights. Make sure that you have a  light by your bed that is easy to reach. Do not use any sheets or blankets that are too big for your bed. They should not hang down onto the floor. Have a firm chair that has side arms. You can use this for support while you get dressed. Do not have throw rugs and other things on the floor that can make you trip. What can I do in the kitchen? Clean up any spills right away. Avoid walking on wet floors. Keep items that you use a lot in easy-to-reach places. If you need to reach something above you, use a strong step stool that has a grab bar. Keep electrical cords out of the way. Do not use floor polish or wax that makes floors slippery. If you must use wax, use non-skid floor wax. Do not have throw rugs and other things on the floor that can make you trip. What can I do with my stairs? Do not leave any items on the stairs. Make sure that there are handrails on both sides of the stairs and use them. Fix handrails that are broken or loose. Make sure that handrails are as long as the stairways. Check any carpeting to make sure that it is firmly attached to the stairs. Fix any carpet that is loose or worn. Avoid having throw rugs at the top or bottom of the stairs. If you do have throw rugs, attach  them to the floor with carpet tape. Make sure that you have a light switch at the top of the stairs and the bottom of the stairs. If you do not have them, ask someone to add them for you. What else can I do to help prevent falls? Wear shoes that: Do not have high heels. Have rubber bottoms. Are comfortable and fit you well. Are closed at the toe. Do not wear sandals. If you use a stepladder: Make sure that it is fully opened. Do not climb a closed stepladder. Make sure that both sides of the stepladder are locked into place. Ask someone to hold it for you, if possible. Clearly mark and make sure that you can see: Any grab bars or handrails. First and last steps. Where the edge of each step  is. Use tools that help you move around (mobility aids) if they are needed. These include: Canes. Walkers. Scooters. Crutches. Turn on the lights when you go into a dark area. Replace any light bulbs as soon as they burn out. Set up your furniture so you have a clear path. Avoid moving your furniture around. If any of your floors are uneven, fix them. If there are any pets around you, be aware of where they are. Review your medicines with your doctor. Some medicines can make you feel dizzy. This can increase your chance of falling. Ask your doctor what other things that you can do to help prevent falls. This information is not intended to replace advice given to you by your health care provider. Make sure you discuss any questions you have with your health care provider. Document Released: 09/03/2009 Document Revised: 04/14/2016 Document Reviewed: 12/12/2014 Elsevier Interactive Patient Education  2017 [employer].

## 2021-09-21 NOTE — Progress Notes (Signed)
I connected with Ricky Graves today by telephone and verified that I am speaking with the correct person using two identifiers. Location patient: home Location provider: work Persons participating in the virtual visit: Ricky Graves, Ricky Graves.   I discussed the limitations, risks, security and privacy concerns of performing an evaluation and management service by telephone and the availability of in person appointments. I also discussed with the patient that there may be a patient responsible charge related to this service. The patient expressed understanding and verbally consented to this telephonic visit.    Interactive audio and video telecommunications were attempted between this provider and patient, however failed, due to patient having technical difficulties OR patient did not have access to video capability.  We continued and completed visit with audio only.     Vital signs may be patient reported or missing.  Subjective:   Ricky Graves is a 70 y.o. male who presents for Medicare Annual/Subsequent preventive examination.  Review of Systems     Cardiac Risk Factors include: advanced age (>57men, >34 women);dyslipidemia;male gender;obesity (BMI >30kg/m2);sedentary lifestyle     Objective:    Today's Vitals   09/21/21 1017  Weight: 213 lb (96.6 kg)  Height: 5\' 10"  (1.778 m)   Body mass index is 30.56 kg/m.  Advanced Directives 09/21/2021 09/15/2020 09/10/2019 09/04/2018 08/23/2017  Does Patient Have a Medical Advance Directive? No No No No No  Does patient want to make changes to medical advance directive? - - - - Yes (MAU/Ambulatory/Procedural Areas - Information given)  Would patient like information on creating a medical advance directive? - - Yes (MAU/Ambulatory/Procedural Areas - Information given) No - Patient declined -    Current Medications (verified) Outpatient Encounter Medications as of 09/21/2021  Medication Sig   acetaminophen (TYLENOL) 325 MG  tablet Take 650 mg by mouth every 6 (six) hours as needed.   ibuprofen (ADVIL) 400 MG tablet Take 400 mg by mouth every 6 (six) hours as needed. As needed, alternates with tyl   pseudoephedrine (SUDAFED) 30 MG tablet Take 30 mg by mouth every 4 (four) hours as needed for congestion. As needed   No facility-administered encounter medications on file as of 09/21/2021.    Allergies (verified) Benadryl [diphenhydramine]   History: Past Medical History:  Diagnosis Date   Adult acne    Cancer of the skin, basal cell    Past Surgical History:  Procedure Laterality Date   TONSILLECTOMY     VASECTOMY  1980   Family History  Problem Relation Age of Onset   Heart disease Father    Stroke Father    Diabetes Father    Diabetes Paternal Grandmother    Social History   Socioeconomic History   Marital status: Married    Spouse name: Not on file   Number of children: Not on file   Years of education: Vocational   Highest education level: Some college, no degree  Occupational History   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  Tobacco Use   Smoking status: Former    Packs/day: 1.50    Years: 30.00    Pack years: 45.00    Types: Cigarettes    Quit date: 05/15/2008    Years since quitting: 13.3   Smokeless tobacco: Never  Vaping Use   Vaping Use: Never used  Substance and Sexual Activity   Alcohol use: Yes    Comment: occasioanlly   Drug use: No   Sexual activity: Not Currently  Other Topics Concern   Not on file  Social History Narrative   Not on file   Social Determinants of Health   Financial Resource Strain: Low Risk    Difficulty of Paying Living Expenses: Not hard at all  Food Insecurity: No Food Insecurity   Worried About Charity fundraiser in the Last Year: Never true   [place] in the Last Year: Never true  Transportation Needs: No Transportation Needs   Lack of Transportation (Medical): No   Lack of Transportation  (Non-Medical): No  Physical Activity: Inactive   Days of Exercise per Week: 0 days   Minutes of Exercise per Session: 0 min  Stress: No Stress Concern Present   Feeling of Stress : Not at all  Social Connections: Not on file    Tobacco Counseling Counseling given: Not Answered   Clinical Intake:  Pre-visit preparation completed: Yes  Pain : No/denies pain     Nutritional Status: BMI > 30  Obese Nutritional Risks: None Diabetes: No  How often do you need to have someone help you when you read instructions, pamphlets, or other written materials from your doctor or pharmacy?: 1 - Never What is the last grade level you completed in school?: some college  Diabetic? no  Interpreter Needed?: No  Information entered by :: NAllen Graves   Activities of Daily Living In your present state of health, do you have any difficulty performing the following activities: 09/21/2021  Hearing? Y  Vision? N  Difficulty concentrating or making decisions? N  Walking or climbing stairs? N  Dressing or bathing? N  Doing errands, shopping? N  Preparing Food and eating ? N  Using the Toilet? N  In the past six months, have you accidently leaked urine? N  Do you have problems with loss of bowel control? N  Managing your Medications? N  Managing your Finances? N  Housekeeping or managing your Housekeeping? N  Some recent data might be hidden    Patient Care Team: Olin Hauser, DO as PCP - General (Family Medicine)  Indicate any recent Medical Services you may have received from other than [facility] providers in the past year (date may be approximate).     Assessment:   This is a routine wellness examination for [facility].  Hearing/Vision screen Vision Screening - Comments:: No regular eye exams, [facility]  Dietary issues and exercise activities discussed: Current Exercise Habits: The patient does not participate in regular exercise at present   Goals Addressed              This Visit's Progress    Patient Stated       09/21/2021, wants to weigh under 200 pounds       Depression Screen PHQ 2/9 Scores 09/21/2021 09/15/2020 03/02/2020 09/10/2019 03/14/2019 03/04/2019 09/04/2018  PHQ - 2 Score 0 0 0 0 0 0 0  PHQ- 9 Score - - - - - - -    Fall Risk Fall Risk  09/21/2021 09/15/2020 03/02/2020 09/10/2019 03/14/2019  Falls in the past year? 0 0 0 0 0  Number falls in past yr: - - 0 0 -  Injury with Fall? - - 0 0 -  Risk for fall due to : No Fall Risks No Fall Risks - - -  Follow up Falls evaluation completed;Education provided;Falls prevention discussed Falls evaluation completed;Education provided;Falls prevention discussed Falls evaluation completed - Falls evaluation completed    FALL RISK PREVENTION PERTAINING TO THE HOME:  Any stairs in or around the home? Yes  If so, are there any without handrails? No  Home free of loose throw rugs in walkways, pet beds, electrical cords, etc? Yes  Adequate lighting in your home to reduce risk of falls? Yes   ASSISTIVE DEVICES UTILIZED TO PREVENT FALLS:  Life alert? No  Use of a cane, walker or w/c? No  Grab bars in the bathroom? No  Shower chair or bench in shower? Yes  Elevated toilet seat or a handicapped toilet? Yes   TIMED UP AND GO:  Was the test performed? No .       Cognitive Function: MMSE - Mini Mental State Exam 08/23/2017  Orientation to time 5  Orientation to Place 5  Registration 3  Attention/ Calculation 5  Recall 3  Language- name 2 objects 2  Language- repeat 1  Language- follow 3 step command 3  Language- read & follow direction 1  Write a sentence 1  Copy design 1  Total score 30     6CIT Screen 09/21/2021 09/15/2020 09/10/2019 09/04/2018  What Year? 0 points 0 points 0 points 0 points  What month? 0 points 0 points 0 points 0 points  What time? 0 points 0 points 0 points 0 points  Count back from 20 0 points 0 points 0 points 0 points  Months in reverse 0 points 0 points  0 points 0 points  Repeat phrase 2 points 0 points 0 points 0 points  Total Score 2 0 0 0    Immunizations Immunization History  Administered Date(s) Administered   Influenza, High Dose Seasonal PF 08/23/2017, 08/13/2019   Influenza-Unspecified 08/21/2018   Janssen (J&J) SARS-COV-2 Vaccination 04/09/2020   Pneumococcal Conjugate-13 09/04/2018   Pneumococcal Polysaccharide-23 08/13/2019    TDAP status: Up to date  Flu Vaccine status: Due, Education has been provided regarding the importance of this vaccine. Advised may receive this vaccine at local pharmacy or Health Dept. Aware to provide a copy of the vaccination record if obtained from local pharmacy or Health Dept. Verbalized acceptance and understanding.  Pneumococcal vaccine status: Up to date  Covid-19 vaccine status: Completed vaccines  Qualifies for Shingles Vaccine? Yes   Zostavax completed No   Shingrix Completed?: No.    Education has been provided regarding the importance of this vaccine. Patient has been advised to call insurance company to determine out of pocket expense if they have not yet received this vaccine. Advised may also receive vaccine at local pharmacy or Health Dept. Verbalized acceptance and understanding.  Screening Tests Health Maintenance  Topic Date Due   Hepatitis C Screening  Never done   Zoster Vaccines- Shingrix (1 of 2) Never done   COLONOSCOPY (Pts 45-44yrs Insurance coverage will need to be confirmed)  Never done   COVID-19 Vaccine (2 - Janssen risk series) 05/07/2020   INFLUENZA VACCINE  06/21/2021   TETANUS/TDAP  01/20/2027   Pneumonia Vaccine 1+ Years old  Completed   HPV VACCINES  Aged Out    Health Maintenance  Health Maintenance Due  Topic Date Due   Hepatitis C Screening  Never done   Zoster Vaccines- Shingrix (1 of 2) Never done   COLONOSCOPY (Pts 45-27yrs Insurance coverage will need to be confirmed)  Never done   COVID-19 Vaccine (2 - Janssen risk series) 05/07/2020    INFLUENZA VACCINE  06/21/2021    Colorectal cancer screening: decline   Lung Cancer Screening: (Low Dose CT Chest recommended if Age 20-80 years, 30 pack-year  currently smoking OR have quit w/in 15years.) does not qualify.   Lung Cancer Screening Referral: no  Additional Screening:  Hepatitis C Screening: does qualify;   Vision Screening: Recommended annual ophthalmology exams for early detection of glaucoma and other disorders of the eye. Is the patient up to date with their annual eye exam?  No  Who is the provider or what is the name of the office in which the patient attends annual eye exams? [facility] If pt is not established with a provider, would they like to be referred to a provider to establish care? No .   Dental Screening: Recommended annual dental exams for proper oral hygiene  Community Resource Referral / Chronic Care Management: CRR required this visit?  No   CCM required this visit?  No      Plan:     I have personally reviewed and noted the following in the patient's chart:   Medical and social history Use of alcohol, tobacco or illicit drugs  Current medications and supplements including opioid prescriptions. Patient is not currently taking opioid prescriptions. Functional ability and status Nutritional status Physical activity Advanced directives List of other physicians Hospitalizations, surgeries, and ER visits in previous 12 months Vitals Screenings to include cognitive, depression, and falls Referrals and appointments  In addition, I have reviewed and discussed with patient certain preventive protocols, quality metrics, and best practice recommendations. A written personalized care plan for preventive services as well as general preventive health recommendations were provided to patient.     Kellie Simmering, Graves   91/07/[id number]   Nurse Notes: none

## 2021-10-04 DIAGNOSIS — Z23 Encounter for immunization: Secondary | ICD-10-CM | POA: Diagnosis not present

## 2022-03-02 ENCOUNTER — Other Ambulatory Visit: Payer: Medicare Other

## 2022-03-07 ENCOUNTER — Other Ambulatory Visit: Payer: Medicare Other

## 2022-03-07 DIAGNOSIS — E669 Obesity, unspecified: Secondary | ICD-10-CM

## 2022-03-07 DIAGNOSIS — R7303 Prediabetes: Secondary | ICD-10-CM

## 2022-03-07 DIAGNOSIS — E782 Mixed hyperlipidemia: Secondary | ICD-10-CM | POA: Diagnosis not present

## 2022-03-07 DIAGNOSIS — R030 Elevated blood-pressure reading, without diagnosis of hypertension: Secondary | ICD-10-CM

## 2022-03-07 DIAGNOSIS — R351 Nocturia: Secondary | ICD-10-CM

## 2022-03-08 LAB — LIPID PANEL
Cholesterol: 204 mg/dL — ABNORMAL HIGH (ref <200)
HDL: 48 mg/dL (ref >=40)
LDL Cholesterol (Calc): 133 mg/dL (calc) — ABNORMAL HIGH
Non-HDL Cholesterol (Calc): 156 mg/dL (calc) — ABNORMAL HIGH (ref <130)
Total CHOL/HDL Ratio: 4.3 (calc) (ref <5.0)
Triglycerides: 122 mg/dL (ref <150)

## 2022-03-08 LAB — CBC WITH DIFFERENTIAL/PLATELET
Absolute Monocytes: 468 cells/uL (ref 200–950)
Basophils Absolute: 33 cells/uL (ref 0–200)
Basophils Relative: 0.5 %
Eosinophils Absolute: 117 cells/uL (ref 15–500)
Eosinophils Relative: 1.8 %
HCT: 43.2 % (ref 38.5–50.0)
Hemoglobin: 14.8 g/dL (ref 13.2–17.1)
Lymphs Abs: 1755 cells/uL (ref 850–3900)
MCH: 30.5 pg (ref 27.0–33.0)
MCHC: 34.3 g/dL (ref 32.0–36.0)
MCV: 88.9 fL (ref 80.0–100.0)
MPV: 10.5 fL (ref 7.5–12.5)
Monocytes Relative: 7.2 %
Neutro Abs: 4128 cells/uL (ref 1500–7800)
Neutrophils Relative %: 63.5 %
Platelets: 209 10*3/uL (ref 140–400)
RBC: 4.86 10*6/uL (ref 4.20–5.80)
RDW: 13 % (ref 11.0–15.0)
Total Lymphocyte: 27 %
WBC: 6.5 10*3/uL (ref 3.8–10.8)

## 2022-03-08 LAB — COMPLETE METABOLIC PANEL WITH GFR
AG Ratio: 1.4 (calc) (ref 1.0–2.5)
ALT: 11 U/L (ref 9–46)
AST: 10 U/L (ref 10–35)
Albumin: 3.6 g/dL (ref 3.6–5.1)
Alkaline phosphatase (APISO): 80 U/L (ref 35–144)
BUN: 13 mg/dL (ref 7–25)
CO2: 25 mmol/L (ref 20–32)
Calcium: 8.6 mg/dL (ref 8.6–10.3)
Chloride: 105 mmol/L (ref 98–110)
Creat: 1.15 mg/dL (ref 0.70–1.35)
Globulin: 2.6 g/dL (calc) (ref 1.9–3.7)
Glucose, Bld: 113 mg/dL — ABNORMAL HIGH (ref 65–99)
Potassium: 4.1 mmol/L (ref 3.5–5.3)
Sodium: 139 mmol/L (ref 135–146)
Total Bilirubin: 0.5 mg/dL (ref 0.2–1.2)
Total Protein: 6.2 g/dL (ref 6.1–8.1)
eGFR: 69 mL/min/{1.73_m2} (ref >=60)

## 2022-03-08 LAB — HEMOGLOBIN A1C
Hgb A1c MFr Bld: 6.3 % of total Hgb — ABNORMAL HIGH (ref <5.7)
Mean Plasma Glucose: 134 mg/dL
eAG (mmol/L): 7.4 mmol/L

## 2022-03-08 LAB — PSA: PSA: 0.84 ng/mL (ref <=4.00)

## 2022-03-09 ENCOUNTER — Encounter: Payer: Medicare Other | Admitting: Family Medicine

## 2022-03-10 NOTE — Progress Notes (Signed)
? ?Annual Wellness Visit ? ?  ? ?Patient: Ricky Graves, Male    DOB: [birth year], 71 y.o.   MRN: [id number] ? ?Today's Provider: Talitha Givens, MHS,PA-C ?Introduced myself to the patient as a Journalist, newspaper and provided education on APPs in clinical practice.  ? ? ?Subjective  ?Chief Complaint  ?Patient presents with  ? Medicare Wellness  ? ? ?Ricky Graves is a 71 y.o. male who presents today for his Annual Wellness Visit. ?He reports consuming a general diet. The patient does not participate in regular exercise at present. He generally feels well. He reports sleeping fairly well. He does not have additional problems to discuss today.  ? ?HPI ? ?Vision:Not within last year , Dental: No current dental problems and No regular dental care , and PSA: Prostate cancer screening and PSA options (with potential risks and benefits of testing vs. not testing) were discussed along with recent recs/guidelines. PSA in normal range  ? ? ?Patient Active Problem List  ? Diagnosis Date Noted  ? Pre-diabetes 05/31/2017  ? Elevated BP without diagnosis of hypertension 05/16/2017  ? Screening for colon cancer 05/16/2017  ? Screening for prostate cancer 05/16/2017  ? Hyperlipidemia 05/15/2017  ? Obesity (BMI 30.0-34.9) 05/15/2017  ? Tinnitus 05/15/2017  ? History of basal cell carcinoma 05/15/2017  ? Environmental and seasonal allergies 05/15/2017  ? Erectile dysfunction 05/15/2017  ? ?Past Medical History:  ?Diagnosis Date  ? Adult acne   ? Cancer of the skin, basal cell   ? ?Past Surgical History:  ?Procedure Laterality Date  ? TONSILLECTOMY    ? VASECTOMY  1980  ? ?Social History  ? ?Socioeconomic History  ? Marital status: Married  ?  Spouse name: Not on file  ? Number of children: Not on file  ? Years of education: Vocational  ? Highest education level: Some college, no degree  ?Occupational History  ? 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  Comment: retired  ?Tobacco Use  ? Smoking status: Former  ?  Packs/day: 1.50  ?   Years: 30.00  ?  Pack years: 45.00  ?  Types: Cigarettes  ?  Quit date: 05/15/2008  ?  Years since quitting: 13.8  ? Smokeless tobacco: Never  ?Vaping Use  ? Vaping Use: Never used  ?Substance and Sexual Activity  ? Alcohol use: Yes  ?  Comment: occasioanlly  ? Drug use: No  ? Sexual activity: Not Currently  ?Other Topics Concern  ? Not on file  ?Social History Narrative  ? Not on file  ? ?Social Determinants of Health  ? ?Financial Resource Strain: Low Risk   ? Difficulty of Paying Living Expenses: Not hard at all  ?Food Insecurity: No Food Insecurity  ? Worried About Charity fundraiser in the Last Year: Never true  ? Ran Out of Food in the Last Year: Never true  ?Transportation Needs: No Transportation Needs  ? Lack of Transportation (Medical): No  ? Lack of Transportation (Non-Medical): No  ?Physical Activity: Inactive  ? Days of Exercise per Week: 0 days  ? Minutes of Exercise per Session: 0 min  ?Stress: No Stress Concern Present  ? Feeling of Stress : Not at all  ?Social Connections: Not on file  ?Intimate Partner Violence: Not on file  ? ?Family Status  ?Relation Name Status  ? Father  Alive  ? PGM  (Not Specified)  ? Mother  Alive  ? ?Family History  ?Problem Relation Age of Onset  ?  Heart disease Father   ? Stroke Father   ? Diabetes Father   ? Diabetes Paternal Grandmother   ? ?Allergies  ?Allergen Reactions  ? Benadryl [Diphenhydramine] Other (See Comments)  ?  (ORAL) "makes me jumpy" ? ?Can use topical cream  ? ?  ? ?Medications: ?Outpatient Medications Prior to Visit  ?Medication Sig  ? acetaminophen (TYLENOL) 325 MG tablet Take 650 mg by mouth every 6 (six) hours as needed.  ? ibuprofen (ADVIL) 400 MG tablet Take 400 mg by mouth every 6 (six) hours as needed. As needed, alternates with tyl  ? pseudoephedrine (SUDAFED) 30 MG tablet Take 30 mg by mouth every 4 (four) hours as needed for congestion. As needed  ? ?No facility-administered medications prior to visit.  ?  ?Allergies  ?Allergen Reactions  ?  Benadryl [Diphenhydramine] Other (See Comments)  ?  (ORAL) "makes me jumpy" ? ?Can use topical cream  ? ? ?Patient Care Team: ?Olin Hauser, DO as PCP - General (Family Medicine) ? ?Review of Systems  ?Constitutional: Negative.  Negative for chills, diaphoresis, fever, malaise/fatigue and weight loss.  ?HENT:  Positive for tinnitus. Negative for congestion, hearing loss and sore throat.   ?Eyes:  Positive for discharge. Negative for blurred vision, double vision and pain.  ?Respiratory:  Negative for cough, shortness of breath and wheezing.   ?Cardiovascular:  Negative for chest pain, palpitations and leg swelling.  ?Gastrointestinal:  Negative for abdominal pain, blood in stool, constipation, diarrhea, heartburn, nausea and vomiting.  ?Genitourinary:  Negative for dysuria and frequency.  ?Musculoskeletal:  Negative for joint pain and myalgias.  ?Skin:  Positive for rash (in groin, responding to cortisone cream). Negative for itching.  ?Neurological:  Negative for dizziness, tingling, tremors, loss of consciousness, weakness and headaches.  ?Endo/Heme/Allergies:  Positive for environmental allergies.  ?Psychiatric/Behavioral:  Negative for depression. The patient is not nervous/anxious and does not have insomnia.   ? ? ?  ? ?Objective  ?BP 140/88   Pulse 64   Ht '5\' 10"'$  (1.778 m)   Wt 221 lb 6.4 oz (100.4 kg)   SpO2 99%   BMI 31.77 kg/m?  ?BP Readings from Last 3 Encounters:  ?03/11/22 140/88  ?03/05/21 138/80  ?03/02/20 140/80  ? ?Wt Readings from Last 3 Encounters:  ?03/11/22 221 lb 6.4 oz (100.4 kg)  ?09/21/21 213 lb (96.6 kg)  ?03/05/21 221 lb 3.2 oz (100.3 kg)  ? ?  ? ?Physical Exam ?Vitals reviewed.  ?Constitutional:   ?   Appearance: Normal appearance. He is obese.  ?HENT:  ?   Head: Normocephalic and atraumatic.  ?   Mouth/Throat:  ?   Mouth: Mucous membranes are moist.  ?   Pharynx: No oropharyngeal exudate or posterior oropharyngeal erythema.  ?Eyes:  ?   Conjunctiva/sclera: Conjunctivae  normal.  ?   Pupils: Pupils are equal, round, and reactive to light.  ?Cardiovascular:  ?   Rate and Rhythm: Normal rate and regular rhythm.  ?   Pulses: Normal pulses.  ?   Heart sounds: Normal heart sounds.  ?Pulmonary:  ?   Effort: Pulmonary effort is normal.  ?   Breath sounds: Normal breath sounds.  ?Abdominal:  ?   General: Abdomen is flat. Bowel sounds are normal.  ?   Palpations: Abdomen is soft.  ?Musculoskeletal:  ?   Cervical back: Normal range of motion and neck supple.  ?Neurological:  ?   Mental Status: He is alert.  ?Psychiatric:     ?  Mood and Affect: Mood normal.     ?   Behavior: Behavior normal.     ?   Thought Content: Thought content normal.     ?   Judgment: Judgment normal.  ? ? ? ? ?Most recent functional status assessment: ? ?  09/21/2021  ? 10:25 AM  ?In your present state of health, do you have any difficulty performing the following activities:  ?Hearing? 1  ?Vision? 0  ?Difficulty concentrating or making decisions? 0  ?Walking or climbing stairs? 0  ?Dressing or bathing? 0  ?Doing errands, shopping? 0  ?Preparing Food and eating ? N  ?Using the Toilet? N  ?In the past six months, have you accidently leaked urine? N  ?Do you have problems with loss of bowel control? N  ?Managing your Medications? N  ?Managing your Finances? N  ?Housekeeping or managing your Housekeeping? N  ? ?Most recent fall risk assessment: ? ?  03/11/2022  ? 10:43 AM  ?Fall Risk   ?Falls in the past year? 0  ?Number falls in past yr: 0  ?Injury with Fall? 0  ?Risk for fall due to : No Fall Risks  ?Follow up Falls evaluation completed  ? ? Most recent depression screenings: ? ?  03/11/2022  ? 10:43 AM 09/21/2021  ? 10:25 AM  ?PHQ 2/9 Scores  ?PHQ - 2 Score 0 0  ?PHQ- 9 Score 0   ? ?Most recent cognitive screening: ? ?  09/21/2021  ? 10:27 AM  ?6CIT Screen  ?What Year? 0 points  ?What month? 0 points  ?What time? 0 points  ?Count back from 20 0 points  ?Months in reverse 0 points  ?Repeat phrase 2 points  ?Total Score 2  points  ? ?Most recent Audit-C alcohol use screening ?   ? View : No data to display.  ?  ?  ?  ? ?A score of 3 or more in women, and 4 or more in men indicates increased risk for alcohol abuse, EXCEPT if all of

## 2022-03-11 ENCOUNTER — Encounter: Payer: Medicare Other | Admitting: Family Medicine

## 2022-03-11 ENCOUNTER — Encounter: Payer: Self-pay | Admitting: Physician Assistant

## 2022-03-11 ENCOUNTER — Ambulatory Visit: Payer: Medicare Other | Admitting: Physician Assistant

## 2022-03-11 VITALS — BP 140/88 | HR 64 | Ht 70.0 in | Wt 221.4 lb

## 2022-03-11 DIAGNOSIS — Z0000 Encounter for general adult medical examination without abnormal findings: Secondary | ICD-10-CM | POA: Diagnosis not present

## 2022-03-11 DIAGNOSIS — Z23 Encounter for immunization: Secondary | ICD-10-CM | POA: Diagnosis not present

## 2022-03-11 MED ORDER — SHINGRIX 50 MCG/0.5ML IM SUSR: 0.5000 mL | Freq: Once | INTRAMUSCULAR | 0 refills | Status: AC

## 2022-03-23 ENCOUNTER — Encounter: Payer: Medicare Other | Admitting: Family Medicine

## 2022-08-30 DIAGNOSIS — Z23 Encounter for immunization: Secondary | ICD-10-CM | POA: Diagnosis not present

## 2022-11-24 ENCOUNTER — Ambulatory Visit (INDEPENDENT_AMBULATORY_CARE_PROVIDER_SITE_OTHER): Payer: Medicare Other | Admitting: Family Medicine

## 2022-11-24 ENCOUNTER — Encounter: Payer: Self-pay | Admitting: Family Medicine

## 2022-11-24 VITALS — BP 136/82 | HR 74 | Ht 70.0 in | Wt 218.8 lb

## 2022-11-24 DIAGNOSIS — J0110 Acute frontal sinusitis, unspecified: Secondary | ICD-10-CM

## 2022-11-24 MED ORDER — PREDNISONE 10 MG PO TABS: ORAL_TABLET | ORAL | 0 refills | Status: DC

## 2022-11-24 MED ORDER — AMOXICILLIN-POT CLAVULANATE 875-125 MG PO TABS: 1.0000 | ORAL_TABLET | Freq: Two times a day (BID) | ORAL | 0 refills | Status: DC

## 2022-11-24 NOTE — Patient Instructions (Addendum)
Thank you for coming to the office today.  Likely sinusitis that may be improving  Keep on Ibuprofen '200mg'$  OTC x 2 to 3 pills = 400-'600mg'$  and take them with meal or food 3 times a day or every 6-8 hours for now about 1 week or less. For swelling pain and symptoms  I would recommend OTC Sudafed as needed for congestion and pressure  If not improving I would suggest antibiotic and steroid combination.   Please schedule a Follow-up Appointment to: Return if symptoms worsen or fail to improve.  If you have any other questions or concerns, please feel free to call the office or send a message through [place]. You may also schedule an earlier appointment if necessary.  Additionally, you may be receiving a survey about your experience at our office within a few days to 1 week by e-mail or mail. We value your feedback.  Nobie Putnam, DO [place]

## 2022-11-24 NOTE — Progress Notes (Signed)
Subjective:    Patient ID: Ricky Graves, male    DOB: 1951-01-16, 72 y.o.   MRN: [id number]  Ricky Graves is a 72 y.o. male presenting on 11/24/2022 for Facial Pain   HPI  Sinusitis Recent acute onset 5-6 days ago sinus congestion headache then some improvement and then cough and low grade temp. Now fevers have resolved. Drainage and congestion still present. Denies body aches or chills. Today significant improvement. He did have some glandular swelling R neck side now improved. No sick contacts.      03/11/2022   10:43 AM 09/21/2021   10:25 AM 09/15/2020   11:38 AM  Depression screen PHQ 2/9  Decreased Interest 0 0 0  Down, Depressed, Hopeless 0 0 0  PHQ - 2 Score 0 0 0  Altered sleeping 0    Tired, decreased energy 0    Change in appetite 0    Feeling bad or failure about yourself  0    Trouble concentrating 0    Moving slowly or fidgety/restless 0    Suicidal thoughts 0    PHQ-9 Score 0    Difficult doing work/chores Not difficult at all      Social History   Tobacco Use   Smoking status: Former    Packs/day: 1.50    Years: 30.00    Total pack years: 45.00    Types: Cigarettes    Quit date: 05/15/2008    Years since quitting: 14.5   Smokeless tobacco: Never  Vaping Use   Vaping Use: Never used  Substance Use Topics   Alcohol use: Yes    Comment: occasioanlly   Drug use: No    Review of Systems Per HPI unless specifically indicated above     Objective:    BP 136/82   Pulse 74   Ht _0  (1.778 m)   Wt 218 lb 12.8 oz (99.2 kg)   SpO2 99%   BMI 31.39 kg/m   Wt Readings from Last 3 Encounters:  11/24/22 218 lb 12.8 oz (99.2 kg)  03/11/22 221 lb 6.4 oz (100.4 kg)  09/21/21 213 lb (96.6 kg)    Physical Exam Vitals and nursing note reviewed.  Constitutional:      General: He is not in acute distress.    Appearance: He is well-developed. He is not diaphoretic.     Comments: Well-appearing, comfortable, cooperative  HENT:     Head:  Normocephalic and atraumatic.  Eyes:     General:        Right eye: No discharge.        Left eye: No discharge.     Conjunctiva/sclera: Conjunctivae normal.  Neck:     Thyroid: No thyromegaly.  Cardiovascular:     Rate and Rhythm: Normal rate and regular rhythm.     Pulses: Normal pulses.     Heart sounds: Normal heart sounds. No murmur heard. Pulmonary:     Effort: Pulmonary effort is normal. No respiratory distress.     Breath sounds: Normal breath sounds. No wheezing or rales.  Musculoskeletal:        General: Normal range of motion.     Cervical back: Normal range of motion and neck supple.  Lymphadenopathy:     Cervical: No cervical adenopathy.  Skin:    General: Skin is warm and dry.     Findings: No erythema or rash.  Neurological:     Mental Status: He is alert and oriented to person, place, and  time. Mental status is at baseline.  Psychiatric:        Behavior: Behavior normal.     Comments: Well groomed, good eye contact, normal speech and thoughts    Results for orders placed or performed in visit on 03/07/22  PSA  Result Value Ref Range   PSA 0.84 < OR = 4.00 ng/mL  Lipid panel  Result Value Ref Range   Cholesterol 204 (H) <200 mg/dL   HDL 48 > OR = 40 mg/dL   Triglycerides 122 <150 mg/dL   LDL Cholesterol (Calc) 133 (H) mg/dL (calc)   Total CHOL/HDL Ratio 4.3 <5.0 (calc)   Non-HDL Cholesterol (Calc) 156 (H) <130 mg/dL (calc)  COMPLETE METABOLIC PANEL WITH GFR  Result Value Ref Range   Glucose, Bld 113 (H) 65 - 99 mg/dL   BUN 13 7 - 25 mg/dL   Creat 1.15 0.70 - 1.35 mg/dL   eGFR 69 > OR = 60 mL/min/1.42m   BUN/Creatinine Ratio NOT APPLICABLE 6 - 22 (calc)   Sodium 139 135 - 146 mmol/L   Potassium 4.1 3.5 - 5.3 mmol/L   Chloride 105 98 - 110 mmol/L   CO2 25 20 - 32 mmol/L   Calcium 8.6 8.6 - 10.3 mg/dL   Total Protein 6.2 6.1 - 8.1 g/dL   Albumin 3.6 3.6 - 5.1 g/dL   Globulin 2.6 1.9 - 3.7 g/dL (calc)   AG Ratio 1.4 1.0 - 2.5 (calc)   Total  Bilirubin 0.5 0.2 - 1.2 mg/dL   Alkaline phosphatase (APISO) 80 35 - 144 U/L   AST 10 10 - 35 U/L   ALT 11 9 - 46 U/L  CBC with Differential/Platelet  Result Value Ref Range   WBC 6.5 3.8 - 10.8 Thousand/uL   RBC 4.86 4.20 - 5.80 Million/uL   Hemoglobin 14.8 13.2 - 17.1 g/dL   HCT 43.2 38.5 - 50.0 %   MCV 88.9 80.0 - 100.0 fL   MCH 30.5 27.0 - 33.0 pg   MCHC 34.3 32.0 - 36.0 g/dL   RDW 13.0 11.0 - 15.0 %   Platelets 209 140 - 400 Thousand/uL   MPV 10.5 7.5 - 12.5 fL   Neutro Abs 4,128 1,500 - 7,800 cells/uL   Lymphs Abs 1,755 850 - 3,900 cells/uL   Absolute Monocytes 468 200 - 950 cells/uL   Eosinophils Absolute 117 15 - 500 cells/uL   Basophils Absolute 33 0 - 200 cells/uL   Neutrophils Relative % 63.5 %   Total Lymphocyte 27.0 %   Monocytes Relative 7.2 %   Eosinophils Relative 1.8 %   Basophils Relative 0.5 %  Hemoglobin A1c  Result Value Ref Range   Hgb A1c MFr Bld 6.3 (H) <5.7 % of total Hgb   Mean Plasma Glucose 134 mg/dL   eAG (mmol/L) 7.4 mmol/L      Assessment & Plan:   Problem List Items Addressed This Visit   None Visit Diagnoses     Acute non-recurrent frontal sinusitis    -  Primary   Relevant Medications   amoxicillin-clavulanate (AUGMENTIN) 875-125 MG tablet   predniSONE (DELTASONE) 10 MG tablet       Likely sinusitis that may be improving  Keep on Ibuprofen 2065mOTC x 2 to 3 pills = 400-60052mnd take them with meal or food 3 times a day or every 6-8 hours for now about 1 week or less. For swelling pain and symptoms  I would recommend OTC Sudafed as needed for congestion and  pressure  If not improving I would suggest antibiotic and steroid combination.  Back up plan only  Meds ordered this encounter  Medications   DISCONTD: predniSONE (DELTASONE) 10 MG tablet    Sig: Take 6 tabs with breakfast Day 1, 5 tabs Day 2, 4 tabs Day 3, 3 tabs Day 4, 2 tabs Day 5, 1 tab Day 6.    Dispense:  21 tablet    Refill:  0   DISCONTD: amoxicillin-clavulanate  (AUGMENTIN) 875-125 MG tablet    Sig: Take 1 tablet by mouth 2 (two) times daily. For 7 days    Dispense:  14 tablet    Refill:  0   amoxicillin-clavulanate (AUGMENTIN) 875-125 MG tablet    Sig: Take 1 tablet by mouth 2 (two) times daily. For 7 days    Dispense:  14 tablet    Refill:  0   predniSONE (DELTASONE) 10 MG tablet    Sig: Take 6 tabs with breakfast Day 1, 5 tabs Day 2, 4 tabs Day 3, 3 tabs Day 4, 2 tabs Day 5, 1 tab Day 6.    Dispense:  21 tablet    Refill:  0      Follow up plan: Return if symptoms worsen or fail to improve.    Nobie Putnam, [facility] 11/24/2022, 11:03 AM

## 2023-02-10 ENCOUNTER — Telehealth: Payer: Self-pay | Admitting: Family Medicine

## 2023-02-10 NOTE — Telephone Encounter (Signed)
Contacted Ricky Graves to schedule their annual wellness visit. Appointment made for 03/10/2023.  Sherol Dade; Care Guide Ambulatory Clinical [facility] Direct Dial: [phone]

## 2023-03-10 ENCOUNTER — Ambulatory Visit (INDEPENDENT_AMBULATORY_CARE_PROVIDER_SITE_OTHER): Payer: Medicare Other

## 2023-03-10 VITALS — Ht 70.0 in | Wt 218.0 lb

## 2023-03-10 DIAGNOSIS — Z0000 Encounter for general adult medical examination without abnormal findings: Secondary | ICD-10-CM

## 2023-03-10 NOTE — Progress Notes (Signed)
I connected with  Ricky Graves on 03/10/23 by a audio enabled telemedicine application and verified that I am speaking with the correct person using two identifiers.  Patient Location: Home  Provider Location: Office/Clinic  I discussed the limitations of evaluation and management by telemedicine. The patient expressed understanding and agreed to proceed.  Subjective:   Ricky Graves is a 72 y.o. male who presents for Medicare Annual/Subsequent preventive examination.  Review of Systems     Cardiac Risk Factors include: advanced age (>84men, >81 women);male gender;obesity (BMI >30kg/m2)     Objective:    There were no vitals filed for this visit. There is no height or weight on file to calculate BMI.     03/10/2023    3:32 PM 09/21/2021   10:23 AM 09/15/2020   11:36 AM 09/10/2019   11:48 AM 09/04/2018   10:52 AM 08/23/2017   10:01 AM  Advanced Directives  Does Patient Have a Medical Advance Directive? No No No No No No  Does patient want to make changes to medical advance directive?      Yes (MAU/Ambulatory/Procedural Areas - Information given)  Would patient like information on creating a medical advance directive? No - Patient declined   Yes (MAU/Ambulatory/Procedural Areas - Information given) No - Patient declined     Current Medications (verified) Outpatient Encounter Medications as of 03/10/2023  Medication Sig   amoxicillin-clavulanate (AUGMENTIN) 875-125 MG tablet Take 1 tablet by mouth 2 (two) times daily. For 7 days (Patient not taking: Reported on 03/10/2023)   predniSONE (DELTASONE) 10 MG tablet Take 6 tabs with breakfast Day 1, 5 tabs Day 2, 4 tabs Day 3, 3 tabs Day 4, 2 tabs Day 5, 1 tab Day 6. (Patient not taking: Reported on 03/10/2023)   No facility-administered encounter medications on file as of 03/10/2023.    Allergies (verified) Benadryl [diphenhydramine]   History: Past Medical History:  Diagnosis Date   Adult acne    Cancer of the skin, basal  cell    Past Surgical History:  Procedure Laterality Date   TONSILLECTOMY     VASECTOMY  1980   Family History  Problem Relation Age of Onset   Heart disease Father    Stroke Father    Diabetes Father    Diabetes Paternal Grandmother    Social History   Socioeconomic History   Marital status: Married    Spouse name: Not on file   Number of children: Not on file   Years of education: Vocational   Highest education level: Some college, no degree  Occupational History   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  Tobacco Use   Smoking status: Former    Packs/day: 1.50    Years: 30.00    Additional pack years: 0.00    Total pack years: 45.00    Types: Cigarettes    Quit date: 05/15/2008    Years since quitting: 14.8   Smokeless tobacco: Never  Vaping Use   Vaping Use: Never used  Substance and Sexual Activity   Alcohol use: Yes    Comment: occasioanlly   Drug use: No   Sexual activity: Not Currently  Other Topics Concern   Not on file  Social History Narrative   Not on file   Social Determinants of Health   Financial Resource Strain: Low Risk  (03/10/2023)   Overall Financial Resource Strain (CARDIA)    Difficulty of Paying Living Expenses: Not hard at all  Food  Insecurity: No Food Insecurity (03/10/2023)   Hunger Vital Sign    Worried About Running Out of Food in the Last Year: Never true    Ran Out of Food in the Last Year: Never true  Transportation Needs: No Transportation Needs (03/10/2023)   PRAPARE - Administrator, Civil Service (Medical): No    Lack of Transportation (Non-Medical): No  Physical Activity: Insufficiently Active (03/10/2023)   Exercise Vital Sign    Days of Exercise per Week: 3 days    Minutes of Exercise per Session: 30 min  Stress: No Stress Concern Present (03/10/2023)   [employer] of Occupational Health - Occupational Stress Questionnaire    Feeling of Stress : Not at all  Social  Connections: Socially Isolated (03/10/2023)   Social Connection and Isolation Panel [NHANES]    Frequency of Communication with Friends and Family: Twice a week    Frequency of Social Gatherings with Friends and Family: Never    Attends Religious Services: Never    Database administrator or Organizations: No    Attends Engineer, structural: Never    Marital Status: Married    Tobacco Counseling Counseling given: Not Answered   Clinical Intake:  Pre-visit preparation completed: Yes  Pain : No/denies pain     Nutritional Risks: None Diabetes: No  How often do you need to have someone help you when you read instructions, pamphlets, or other written materials from your doctor or pharmacy?: 1 - Never  Diabetic?NO  Interpreter Needed?: No  Information entered by :: Kennedy Bucker, LPN   Activities of Daily Living    03/10/2023    3:33 PM  In your present state of health, do you have any difficulty performing the following activities:  Hearing? 0  Vision? 0  Difficulty concentrating or making decisions? 0  Walking or climbing stairs? 0  Dressing or bathing? 0  Doing errands, shopping? 0  Preparing Food and eating ? N  Using the Toilet? N  In the past six months, have you accidently leaked urine? N  Do you have problems with loss of bowel control? N  Managing your Medications? N  Managing your Finances? N  Housekeeping or managing your Housekeeping? N    Patient Care Team: Smitty Cords, DO as PCP - General (Family Medicine)  Indicate any recent Medical Services you may have received from other than [facility] providers in the past year (date may be approximate).     Assessment:   This is a routine wellness examination for [facility].  Hearing/Vision screen Hearing Screening - Comments:: NO AIDS Vision Screening - Comments:: READERS- [facility]  Dietary issues and exercise activities discussed: Current Exercise Habits: Home exercise routine, Type  of exercise: walking, Time (Minutes): 30, Frequency (Times/Week): 3, Weekly Exercise (Minutes/Week): 90, Intensity: Mild   Goals Addressed             This Visit's Progress    DIET - EAT MORE FRUITS AND VEGETABLES         Depression Screen    03/10/2023    3:30 PM 03/11/2022   10:43 AM 09/21/2021   10:25 AM 09/15/2020   11:38 AM 03/02/2020    9:02 AM 09/10/2019   11:45 AM 03/14/2019    9:29 AM  PHQ 2/9 Scores  PHQ - 2 Score 0 0 0 0 0 0 0  PHQ- 9 Score 0 0         Fall Risk    03/10/2023  3:32 PM 03/11/2022   10:43 AM 09/21/2021   10:24 AM 09/15/2020   11:38 AM 03/02/2020    9:02 AM  Fall Risk   Falls in the past year? 0 0 0 0 0  Number falls in past yr: 0 0   0  Injury with Fall? 0 0   0  Risk for fall due to : No Fall Risks No Fall Risks No Fall Risks No Fall Risks   Follow up Falls prevention discussed;Falls evaluation completed Falls evaluation completed Falls evaluation completed;Education provided;Falls prevention discussed Falls evaluation completed;Education provided;Falls prevention discussed Falls evaluation completed    FALL RISK PREVENTION PERTAINING TO THE HOME:  Any stairs in or around the home? Yes  If so, are there any without handrails? No  Home free of loose throw rugs in walkways, pet beds, electrical cords, etc? Yes  Adequate lighting in your home to reduce risk of falls? Yes   ASSISTIVE DEVICES UTILIZED TO PREVENT FALLS:  Life alert? No  Use of a cane, walker or w/c? No  Grab bars in the bathroom? No  Shower chair or bench in shower? Yes  Elevated toilet seat or a handicapped toilet? Yes    Cognitive Function:    08/23/2017   10:21 AM  MMSE - Mini Mental State Exam  Orientation to time 5  Orientation to Place 5  Registration 3  Attention/ Calculation 5  Recall 3  Language- name 2 objects 2  Language- repeat 1  Language- follow 3 step command 3  Language- read & follow direction 1  Write a sentence 1  Copy design 1  Total score 30         03/10/2023    3:40 PM 09/21/2021   10:27 AM 09/15/2020   11:40 AM 09/10/2019   11:53 AM 09/04/2018   10:15 AM  6CIT Screen  What Year? 0 points 0 points 0 points 0 points 0 points  What month? 0 points 0 points 0 points 0 points 0 points  What time? 0 points 0 points 0 points 0 points 0 points  Count back from 20 0 points 0 points 0 points 0 points 0 points  Months in reverse 0 points 0 points 0 points 0 points 0 points  Repeat phrase 0 points 2 points 0 points 0 points 0 points  Total Score 0 points 2 points 0 points 0 points 0 points    Immunizations Immunization History  Administered Date(s) Administered   Influenza, High Dose Seasonal PF 08/23/2017, 08/13/2019   Influenza-Unspecified 08/21/2018   Janssen (J&J) SARS-COV-2 Vaccination 04/09/2020   Pneumococcal Conjugate-13 09/04/2018   Pneumococcal Polysaccharide-23 08/13/2019    TDAP status: Due, Education has been provided regarding the importance of this vaccine. Advised may receive this vaccine at local pharmacy or Health Dept. Aware to provide a copy of the vaccination record if obtained from local pharmacy or Health Dept. Verbalized acceptance and understanding.  Flu Vaccine status: Declined, Education has been provided regarding the importance of this vaccine but patient still declined. Advised may receive this vaccine at local pharmacy or Health Dept. Aware to provide a copy of the vaccination record if obtained from local pharmacy or Health Dept. Verbalized acceptance and understanding.  Pneumococcal vaccine status: Up to date  Covid-19 vaccine status: Completed vaccines  Qualifies for Shingles Vaccine? Yes   Zostavax completed No   Shingrix Completed?: No.    Education has been provided regarding the importance of this vaccine. Patient has been advised to call insurance  company to determine out of pocket expense if they have not yet received this vaccine. Advised may also receive vaccine at local pharmacy or  Health Dept. Verbalized acceptance and understanding.  Screening Tests Health Maintenance  Topic Date Due   Hepatitis C Screening  Never done   DTaP/Tdap/Td (1 - Tdap) Never done   Zoster Vaccines- Shingrix (1 of 2) Never done   Lung Cancer Screening  Never done   COVID-19 Vaccine (2 - Janssen risk series) 05/07/2020   COLONOSCOPY (Pts 45-54yrs Insurance coverage will need to be confirmed)  03/12/2023 (Originally 05/14/1997)   INFLUENZA VACCINE  06/22/2023   Medicare Annual Wellness (AWV)  03/09/2024   Pneumonia Vaccine 32+ Years old  Completed   HPV VACCINES  Aged Out    Health Maintenance  Health Maintenance Due  Topic Date Due   Hepatitis C Screening  Never done   DTaP/Tdap/Td (1 - Tdap) Never done   Zoster Vaccines- Shingrix (1 of 2) Never done   Lung Cancer Screening  Never done   COVID-19 Vaccine (2 - Janssen risk series) 05/07/2020    DECLINED REFERRAL FOR COLONOSCOPY  Lung Cancer Screening: (Low Dose CT Chest recommended if Age 4-80 years, 30 pack-year currently smoking OR have quit w/in 15years.) does not qualify.   Additional Screening:  Hepatitis C Screening: does qualify; Completed NO  Vision Screening: Recommended annual ophthalmology exams for early detection of glaucoma and other disorders of the eye. Is the patient up to date with their annual eye exam?  Yes  Who is the provider or what is the name of the office in which the patient attends annual eye exams? [facility] If pt is not established with a provider, would they like to be referred to a provider to establish care? No .   Dental Screening: Recommended annual dental exams for proper oral hygiene  Community Resource Referral / Chronic Care Management: CRR required this visit?  No   CCM required this visit?  No      Plan:     I have personally reviewed and noted the following in the patient's chart:   Medical and social history Use of alcohol, tobacco or illicit drugs  Current  medications and supplements including opioid prescriptions. Patient is not currently taking opioid prescriptions. Functional ability and status Nutritional status Physical activity Advanced directives List of other physicians Hospitalizations, surgeries, and ER visits in previous 12 months Vitals Screenings to include cognitive, depression, and falls Referrals and appointments  In addition, I have reviewed and discussed with patient certain preventive protocols, quality metrics, and best practice recommendations. A written personalized care plan for preventive services as well as general preventive health recommendations were provided to patient.     Hal Hope, LPN   [id number]   Nurse Notes: [facility]

## 2023-03-10 NOTE — Patient Instructions (Signed)
[name] , Thank you for taking time to come for your Medicare Wellness Visit. I appreciate your ongoing commitment to your health goals. Please review the following plan we discussed and let me know if I can assist you in the future.   These are the goals we discussed:  Goals      DIET - EAT MORE FRUITS AND VEGETABLES     DIET - INCREASE WATER INTAKE     Recommend drinking at least 6-8 glasses of water a day      Patient Stated     03/11/22 Would like to camp more   09/15/2020, wants to get under 200 pounds     Patient Stated     09/21/2021, wants to weigh under 200 pounds     Weight (lb) < 200 lb (90.7 kg)     Wants to be at 165 lbs quit smoking made weight gain 221 lbs        This is a list of the screening recommended for you and due dates:  Health Maintenance  Topic Date Due   Hepatitis C Screening: USPSTF Recommendation to screen - Ages 61-79 yo.  Never done   DTaP/Tdap/Td vaccine (1 - Tdap) Never done   Zoster (Shingles) Vaccine (1 of 2) Never done   Screening for Lung Cancer  Never done   COVID-19 Vaccine (2 - Janssen risk series) 05/07/2020   Colon Cancer Screening  03/12/2023*   Flu Shot  06/22/2023   Medicare Annual Wellness Visit  03/09/2024   Pneumonia Vaccine  Completed   HPV Vaccine  Aged Out  *Topic was postponed. The date shown is not the original due date.    Advanced directives: NO  Conditions/risks identified: NONE  Next appointment: Follow up in one year for your annual wellness visit. 03/15/24 @ 2:30 PM BY PHONE  Preventive Care 65 Years and Older, Male  Preventive care refers to lifestyle choices and visits with your health care provider that can promote health and wellness. What does preventive care include? A yearly physical exam. This is also called an annual well check. Dental exams once or twice a year. Routine eye exams. Ask your health care provider how often you should have your eyes checked. Personal lifestyle choices,  including: Daily care of your teeth and gums. Regular physical activity. Eating a healthy diet. Avoiding tobacco and drug use. Limiting alcohol use. Practicing safe sex. Taking low doses of aspirin every day. Taking vitamin and mineral supplements as recommended by your health care provider. What happens during an annual well check? The services and screenings done by your health care provider during your annual well check will depend on your age, overall health, lifestyle risk factors, and family history of disease. Counseling  Your health care provider may ask you questions about your: Alcohol use. Tobacco use. Drug use. Emotional well-being. Home and relationship well-being. Sexual activity. Eating habits. History of falls. Memory and ability to understand (cognition). Work and work Astronomer. Screening  You may have the following tests or measurements: Height, weight, and BMI. Blood pressure. Lipid and cholesterol levels. These may be checked every 5 years, or more frequently if you are over 75 years old. Skin check. Lung cancer screening. You may have this screening every year starting at age 69 if you have a 30-pack-year history of smoking and currently smoke or have quit within the past 15 years. Fecal occult blood test (FOBT) of the stool. You may have this test every year starting at  age 3. Flexible sigmoidoscopy or colonoscopy. You may have a sigmoidoscopy every 5 years or a colonoscopy every 10 years starting at age 61. Prostate cancer screening. Recommendations will vary depending on your family history and other risks. Hepatitis C blood test. Hepatitis B blood test. Sexually transmitted disease (STD) testing. Diabetes screening. This is done by checking your blood sugar (glucose) after you have not eaten for a while (fasting). You may have this done every 1-3 years. Abdominal aortic aneurysm (AAA) screening. You may need this if you are a current or former  smoker. Osteoporosis. You may be screened starting at age 24 if you are at high risk. Talk with your health care provider about your test results, treatment options, and if necessary, the need for more tests. Vaccines  Your health care provider may recommend certain vaccines, such as: Influenza vaccine. This is recommended every year. Tetanus, diphtheria, and acellular pertussis (Tdap, Td) vaccine. You may need a Td booster every 10 years. Zoster vaccine. You may need this after age 28. Pneumococcal 13-valent conjugate (PCV13) vaccine. One dose is recommended after age 70. Pneumococcal polysaccharide (PPSV23) vaccine. One dose is recommended after age 35. Talk to your health care provider about which screenings and vaccines you need and how often you need them. This information is not intended to replace advice given to you by your health care provider. Make sure you discuss any questions you have with your health care provider. Document Released: 12/04/2015 Document Revised: 07/27/2016 Document Reviewed: 09/08/2015 Elsevier Interactive Patient Education  2017 [employer].  Fall Prevention in the Home Falls can cause injuries. They can happen to people of all ages. There are many things you can do to make your home safe and to help prevent falls. What can I do on the outside of my home? Regularly fix the edges of walkways and driveways and fix any cracks. Remove anything that might make you trip as you walk through a door, such as a raised step or threshold. Trim any bushes or trees on the path to your home. Use bright outdoor lighting. Clear any walking paths of anything that might make someone trip, such as rocks or tools. Regularly check to see if handrails are loose or broken. Make sure that both sides of any steps have handrails. Any raised decks and porches should have guardrails on the edges. Have any leaves, snow, or ice cleared regularly. Use sand or salt on walking paths during  winter. Clean up any spills in your garage right away. This includes oil or grease spills. What can I do in the bathroom? Use night lights. Install grab bars by the toilet and in the tub and shower. Do not use towel bars as grab bars. Use non-skid mats or decals in the tub or shower. If you need to sit down in the shower, use a plastic, non-slip stool. Keep the floor dry. Clean up any water that spills on the floor as soon as it happens. Remove soap buildup in the tub or shower regularly. Attach bath mats securely with double-sided non-slip rug tape. Do not have throw rugs and other things on the floor that can make you trip. What can I do in the bedroom? Use night lights. Make sure that you have a light by your bed that is easy to reach. Do not use any sheets or blankets that are too big for your bed. They should not hang down onto the floor. Have a firm chair that has side arms. You can  use this for support while you get dressed. Do not have throw rugs and other things on the floor that can make you trip. What can I do in the kitchen? Clean up any spills right away. Avoid walking on wet floors. Keep items that you use a lot in easy-to-reach places. If you need to reach something above you, use a strong step stool that has a grab bar. Keep electrical cords out of the way. Do not use floor polish or wax that makes floors slippery. If you must use wax, use non-skid floor wax. Do not have throw rugs and other things on the floor that can make you trip. What can I do with my stairs? Do not leave any items on the stairs. Make sure that there are handrails on both sides of the stairs and use them. Fix handrails that are broken or loose. Make sure that handrails are as long as the stairways. Check any carpeting to make sure that it is firmly attached to the stairs. Fix any carpet that is loose or worn. Avoid having throw rugs at the top or bottom of the stairs. If you do have throw rugs,  attach them to the floor with carpet tape. Make sure that you have a light switch at the top of the stairs and the bottom of the stairs. If you do not have them, ask someone to add them for you. What else can I do to help prevent falls? Wear shoes that: Do not have high heels. Have rubber bottoms. Are comfortable and fit you well. Are closed at the toe. Do not wear sandals. If you use a stepladder: Make sure that it is fully opened. Do not climb a closed stepladder. Make sure that both sides of the stepladder are locked into place. Ask someone to hold it for you, if possible. Clearly mark and make sure that you can see: Any grab bars or handrails. First and last steps. Where the edge of each step is. Use tools that help you move around (mobility aids) if they are needed. These include: Canes. Walkers. Scooters. Crutches. Turn on the lights when you go into a dark area. Replace any light bulbs as soon as they burn out. Set up your furniture so you have a clear path. Avoid moving your furniture around. If any of your floors are uneven, fix them. If there are any pets around you, be aware of where they are. Review your medicines with your doctor. Some medicines can make you feel dizzy. This can increase your chance of falling. Ask your doctor what other things that you can do to help prevent falls. This information is not intended to replace advice given to you by your health care provider. Make sure you discuss any questions you have with your health care provider. Document Released: 09/03/2009 Document Revised: 04/14/2016 Document Reviewed: 12/12/2014 Elsevier Interactive Patient Education  2017 [employer].

## 2024-03-15 ENCOUNTER — Encounter: Payer: Self-pay | Admitting: Family Medicine

## 2024-03-15 ENCOUNTER — Ambulatory Visit: Payer: Medicare Other

## 2024-03-15 ENCOUNTER — Ambulatory Visit: Admitting: Family Medicine

## 2024-03-15 VITALS — BP 130/80 | HR 70 | Resp 18 | Ht 70.0 in | Wt 230.2 lb

## 2024-03-15 DIAGNOSIS — M25562 Pain in left knee: Secondary | ICD-10-CM | POA: Diagnosis not present

## 2024-03-15 DIAGNOSIS — Z0000 Encounter for general adult medical examination without abnormal findings: Secondary | ICD-10-CM | POA: Diagnosis not present

## 2024-03-15 DIAGNOSIS — S8392XA Sprain of unspecified site of left knee, initial encounter: Secondary | ICD-10-CM

## 2024-03-15 DIAGNOSIS — M1712 Unilateral primary osteoarthritis, left knee: Secondary | ICD-10-CM

## 2024-03-15 NOTE — Progress Notes (Signed)
 Subjective:    Patient ID: Ricky Graves, male    DOB: [birth year], 73 y.o.   MRN: [id number]  Ricky Graves is a 73 y.o. male presenting on 03/15/2024 for Knee Pain (For 2 weeks, however, getting better today after making the appointment )  Patient presents for a same day appointment.   HPI  Discussed the use of AI scribe software for clinical note transcription with the patient, who gave verbal consent to proceed.  History of Present Illness   Ricky Graves is a 73 year old male who presents with left knee swelling and pain after a camping trip.  He has been experiencing swelling and pain in his left knee following a camping trip approximately a week ago. The swelling began the day after keeping his feet elevated due to rain. He is unsure if he twisted or pulled something [place] but there was no major trauma, injury, or fall.  The knee pain has been slowly improving over the past week. The pain is located on the medial aspect of the left knee and worsens with certain movements and standing. He describes a grinding sensation in the knee, likened to 'a little bumpy road'.  He has not had any x-rays done on the knee. He has been using over-the-counter NSAIDs, possibly topical  Voltaren or diclofenac, and arthritis strength Tylenol for pain management. He has not used any knee braces or sleeves before, as this is the first time he has experienced knee issues.  He has not taken any medication for the knee in the last couple of days, except for sinus-related issues. The knee felt significantly better yesterday.  No major trauma, injury, or fall related to the knee pain. The pain is not present when touching the knee, only when moving or standing in certain ways.          03/15/2024   10:07 AM 03/10/2023    3:30 PM 03/11/2022   10:43 AM  Depression screen PHQ 2/9  Decreased Interest 0 0 0  Down, Depressed, Hopeless 0 0 0  PHQ - 2 Score 0 0 0   Altered sleeping 0 0 0  Tired, decreased energy 0 0 0  Change in appetite 0 0 0  Feeling bad or failure about yourself  0 0 0  Trouble concentrating 0 0 0  Moving slowly or fidgety/restless 0 0 0  Suicidal thoughts 0 0 0  PHQ-9 Score 0 0 0  Difficult doing work/chores Not difficult at all Not difficult at all Not difficult at all       03/15/2024   10:07 AM 03/11/2022   10:43 AM  GAD 7 : Generalized Anxiety Score  Nervous, Anxious, on Edge 0 0  Control/stop worrying 0 0  Worry too much - different things 0 0  Trouble relaxing 0 0  Restless 0 0  Easily annoyed or irritable 0 0  Afraid - awful might happen 0 0  Total GAD 7 Score 0 0  Anxiety Difficulty Not difficult at all Not difficult at all    Social History   Tobacco Use   Smoking status: Former    Current packs/day: 0.00    Average packs/day: 1.5 packs/day for 30.0 years (45.0 ttl pk-yrs)    Types: Cigarettes    Start date: 05/15/1978    Quit date: 05/15/2008    Years since quitting: 15.8   Smokeless tobacco: Never  Vaping Use   Vaping  status: Never Used  Substance Use Topics   Alcohol use: Yes    Comment: occasioanlly   Drug use: No    Review of Systems Per HPI unless specifically indicated above     Objective:    BP 130/80 (BP Location: Right Arm, Patient Position: Sitting, Cuff Size: Normal)   Pulse 70   Resp 18   Ht 5\' 10"  (1.778 m)   Wt 230 lb 3.2 oz (104.4 kg)   SpO2 99%   BMI 33.03 kg/m   Wt Readings from Last 3 Encounters:  03/15/24 230 lb 3.2 oz (104.4 kg)  03/10/23 218 lb (98.9 kg)  11/24/22 218 lb 12.8 oz (99.2 kg)    Physical Exam Vitals and nursing note reviewed.  Constitutional:      General: He is not in acute distress.    Appearance: Normal appearance. He is well-developed. He is not diaphoretic.     Comments: Well-appearing, comfortable, cooperative  HENT:     Head: Normocephalic and atraumatic.  Eyes:     General:        Right eye: No discharge.        Left eye: No  discharge.     Conjunctiva/sclera: Conjunctivae normal.  Cardiovascular:     Rate and Rhythm: Normal rate.  Pulmonary:     Effort: Pulmonary effort is normal.  Musculoskeletal:     Comments: Left Knee Inspection: Very minimal bulky appearance with minor effusion No ecchymosis Palpation: Mild medial tenderness.Fine crepitus bilateral knees ROM: Full active ROM bilaterally Special Testing: Lachman / Valgus/Varus tests negative with intact ligaments (ACL, MCL, LCL). Strength: 5/5 intact knee flex/ext, ankle dorsi/plantarflex Neurovascular: distally intact sensation light touch and pulses   Skin:    General: Skin is warm and dry.     Findings: No erythema or rash.  Neurological:     Mental Status: He is alert and oriented to person, place, and time.  Psychiatric:        Mood and Affect: Mood normal.        Behavior: Behavior normal.        Thought Content: Thought content normal.     Comments: Well groomed, good eye contact, normal speech and thoughts     Results for orders placed or performed in visit on 03/07/22  PSA   Collection Time: 03/07/22  9:07 AM  Result Value Ref Range   PSA 0.84 < OR = 4.00 ng/mL  Lipid panel   Collection Time: 03/07/22  9:07 AM  Result Value Ref Range   Cholesterol 204 (H) <200 mg/dL   HDL 48 > OR = 40 mg/dL   Triglycerides 161 <096 mg/dL   LDL Cholesterol (Calc) 133 (H) mg/dL (calc)   Total CHOL/HDL Ratio 4.3 <5.0 (calc)   Non-HDL Cholesterol (Calc) 156 (H) <130 mg/dL (calc)  COMPLETE METABOLIC PANEL WITH GFR   Collection Time: 03/07/22  9:07 AM  Result Value Ref Range   Glucose, Bld 113 (H) 65 - 99 mg/dL   BUN 13 7 - 25 mg/dL   Creat 0.45 4.09 - 8.11 mg/dL   eGFR 69 > OR = 60 BJ/YNW/2.95A2   BUN/Creatinine Ratio NOT APPLICABLE 6 - 22 (calc)   Sodium 139 135 - 146 mmol/L   Potassium 4.1 3.5 - 5.3 mmol/L   Chloride 105 98 - 110 mmol/L   CO2 25 20 - 32 mmol/L   Calcium 8.6 8.6 - 10.3 mg/dL   Total Protein 6.2 6.1 - 8.1 g/dL   Albumin 3.6  3.6 - 5.1 g/dL   Globulin 2.6 1.9 - 3.7 g/dL (calc)   AG Ratio 1.4 1.0 - 2.5 (calc)   Total Bilirubin 0.5 0.2 - 1.2 mg/dL   Alkaline phosphatase (APISO) 80 35 - 144 U/L   AST 10 10 - 35 U/L   ALT 11 9 - 46 U/L  CBC with Differential/Platelet   Collection Time: 03/07/22  9:07 AM  Result Value Ref Range   WBC 6.5 3.8 - 10.8 Thousand/uL   RBC 4.86 4.20 - 5.80 Million/uL   Hemoglobin 14.8 13.2 - 17.1 g/dL   HCT 21.3 08.6 - 57.8 %   MCV 88.9 80.0 - 100.0 fL   MCH 30.5 27.0 - 33.0 pg   MCHC 34.3 32.0 - 36.0 g/dL   RDW 46.9 62.9 - 52.8 %   Platelets 209 140 - 400 Thousand/uL   MPV 10.5 7.5 - 12.5 fL   Neutro Abs 4,128 1,500 - 7,800 cells/uL   Lymphs Abs 1,755 850 - 3,900 cells/uL   Absolute Monocytes 468 200 - 950 cells/uL   Eosinophils Absolute 117 15 - 500 cells/uL   Basophils Absolute 33 0 - 200 cells/uL   Neutrophils Relative % 63.5 %   Total Lymphocyte 27.0 %   Monocytes Relative 7.2 %   Eosinophils Relative 1.8 %   Basophils Relative 0.5 %  Hemoglobin A1c   Collection Time: 03/07/22  9:07 AM  Result Value Ref Range   Hgb A1c MFr Bld 6.3 (H) <5.7 % of total Hgb   Mean Plasma Glucose 134 mg/dL   eAG (mmol/L) 7.4 mmol/L      Assessment & Plan:   Problem List Items Addressed This Visit   None Visit Diagnoses       Acute pain of left knee    -  Primary     Primary osteoarthritis of left knee         Sprain of left knee, unspecified ligament, initial encounter            Acute Left Knee sprain Swelling and discomfort in the left knee suggest a minor sprain or overuse injury. Mild swelling and grinding indicate possible cartilage wear / osteoarthritis DJD likely  Improvement noted now 1 week later at this appointment.  Conservative care - Recommend knee compression sleeve for support. - Advise elevation to reduce swelling / RICE therapy - Suggest OTC topical NSAIDs like Voltaren for swelling and pain. - Discuss oral NSAIDs for severe pain, cautioning about kidney  side effects. - Offer x-ray for further evaluation if desired. No X-ray on Friday can order for next week or send to other location     No orders of the defined types were placed in this encounter.   No orders of the defined types were placed in this encounter.   Follow up plan: Return if symptoms worsen or fail to improve.   Domingo Friend, DO [facility] 03/15/2024, 10:37 AM

## 2024-03-15 NOTE — Patient Instructions (Addendum)
 Thank you for coming to the office today.  You most likely have a Left knee sprain - this is an injury to the ligaments supporting the knee, it can be a minor injury or small tear, rarely it can turn out to be a larger tear to the ligament if it does not improve, we cannot rule this out.   No X-ray today we can reconsider in future. Here we do x-ray Mon-Thurs, not Friday  START anti inflammatory topical - OTC Voltaren (generic Diclofenac) topical 2-4 times a day as needed for pain swelling of affected joint for 1-2 weeks or longer.  - It is safe to take Tylenol Ext Str 500mg  tabs - take 1 to 2 (max dose 1000mg ) every 6 hours as needed for breakthrough pain, max 24 hour daily dose is 6 to 8 tablets or 4000mg   Use RICE therapy: - R - Rest / relative rest with activity modification avoid overuse and frequent bending or pressure on bent knee - I - Ice packs (make sure you use a towel or sock / something to protect skin) - C - Compression with ACE wrap or COMPRESSION SLEEVE apply pressure and reduce swelling allowing more support - E - Elevation - if significant swelling, lift leg above heart level (toes above your nose) to help reduce swelling, most helpful at night after day of being on your feet  If not improving in future, referral to orthopedics for 2nd opinion  [facility] (formerly [facility]) Address: [address] [place], [place] [address] Hours:  9AM-5PM Phone: [phone]  Orthopedics should also have an Urgent Care Walk In Availability for after hours.   Please schedule a Follow-up Appointment to: Return if symptoms worsen or fail to improve.  If you have any other questions or concerns, please feel free to call the office or send a message through MyChart. You may also schedule an earlier appointment if necessary.  Additionally, you may be receiving a survey about your experience at our office within a few days to 1 week by e-mail or mail. We value your  feedback.  Domingo Friend, DO [facility], [place]

## 2024-03-15 NOTE — Progress Notes (Signed)
 Subjective:   Ricky Graves is a 73 y.o. who presents for a Medicare Wellness preventive visit.  Visit Complete: Virtual I connected with  Ricky Graves on 03/15/24 by a audio enabled telemedicine application and verified that I am speaking with the correct person using two identifiers.  Patient Location: Home  Provider Location: Office/Clinic  I discussed the limitations of evaluation and management by telemedicine. The patient expressed understanding and agreed to proceed.  Vital Signs: Because this visit was a virtual/telehealth visit, some criteria may be missing or patient reported. Any vitals not documented were not able to be obtained and vitals that have been documented are patient reported.  VideoDeclined- This patient declined Librarian, academic. Therefore the visit was completed with audio only.  Persons Participating in Visit: Patient.  AWV Questionnaire: No: Patient Medicare AWV questionnaire was not completed prior to this visit.  Cardiac Risk Factors include: advanced age (>62men, >59 women);dyslipidemia;male gender;obesity (BMI >30kg/m2)     Objective:    There were no vitals filed for this visit. There is no height or weight on file to calculate BMI.     03/15/2024    2:42 PM 03/10/2023    3:32 PM 09/21/2021   10:23 AM 09/15/2020   11:36 AM 09/10/2019   11:48 AM 09/04/2018   10:52 AM 08/23/2017   10:01 AM  Advanced Directives  Does Patient Have a Medical Advance Directive? No No No No No No No  Does patient want to make changes to medical advance directive?       Yes (MAU/Ambulatory/Procedural Areas - Information given)  Would patient like information on creating a medical advance directive? No - Patient declined No - Patient declined   Yes (MAU/Ambulatory/Procedural Areas - Information given) No - Patient declined     Current Medications (verified) Outpatient Encounter Medications as of 03/15/2024  Medication Sig    [DISCONTINUED] amoxicillin -clavulanate (AUGMENTIN ) 875-125 MG tablet Take 1 tablet by mouth 2 (two) times daily. For 7 days (Patient not taking: Reported on 03/10/2023)   [DISCONTINUED] predniSONE  (DELTASONE ) 10 MG tablet Take 6 tabs with breakfast Day 1, 5 tabs Day 2, 4 tabs Day 3, 3 tabs Day 4, 2 tabs Day 5, 1 tab Day 6. (Patient not taking: Reported on 03/10/2023)   No facility-administered encounter medications on file as of 03/15/2024.    Allergies (verified) Benadryl [diphenhydramine]   History: Past Medical History:  Diagnosis Date   Adult acne    Cancer of the skin, basal cell    Past Surgical History:  Procedure Laterality Date   TONSILLECTOMY     VASECTOMY  1980   Family History  Problem Relation Age of Onset   Heart disease Father    Stroke Father    Diabetes Father    Diabetes Paternal Grandmother    Social History   Socioeconomic History   Marital status: Married    Spouse name: Not on file   Number of children: Not on file   Years of education: Vocational   Highest education level: Some college, no degree  Occupational History   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  Tobacco Use   Smoking status: Former    Current packs/day: 0.00    Average packs/day: 1.5 packs/day for 30.0 years (45.0 ttl pk-yrs)    Types: Cigarettes    Start date: 05/15/1978    Quit date: 05/15/2008    Years since quitting: 15.8   Smokeless tobacco:  Never  Vaping Use   Vaping status: Never Used  Substance and Sexual Activity   Alcohol use: Yes    Comment: occasioanlly   Drug use: No   Sexual activity: Not Currently  Other Topics Concern   Not on file  Social History Narrative   Not on file   Social Drivers of Health   Financial Resource Strain: Low Risk  (03/15/2024)   Overall Financial Resource Strain (CARDIA)    Difficulty of Paying Living Expenses: Not hard at all  Food Insecurity: No Food Insecurity (03/15/2024)   Hunger Vital Sign     Worried About Running Out of Food in the Last Year: Never true    Ran Out of Food in the Last Year: Never true  Transportation Needs: No Transportation Needs (03/15/2024)   PRAPARE - Administrator, Civil Service (Medical): No    Lack of Transportation (Non-Medical): No  Physical Activity: Sufficiently Active (03/15/2024)   Exercise Vital Sign    Days of Exercise per Week: 3 days    Minutes of Exercise per Session: 60 min  Stress: No Stress Concern Present (03/15/2024)   [employer] of Occupational Health - Occupational Stress Questionnaire    Feeling of Stress : Not at all  Social Connections: Moderately Isolated (03/15/2024)   Social Connection and Isolation Panel [NHANES]    Frequency of Communication with Friends and Family: More than three times a week    Frequency of Social Gatherings with Friends and Family: Never    Attends Religious Services: Never    Database administrator or Organizations: No    Attends Engineer, structural: Never    Marital Status: Married    Tobacco Counseling Counseling given: Not Answered    Clinical Intake:  Pre-visit preparation completed: Yes  Pain : No/denies pain     BMI - recorded: 33.03 Nutritional Status: BMI > 30  Obese Nutritional Risks: None Diabetes: No  Lab Results  Component Value Date   HGBA1C 6.3 (H) 03/07/2022   HGBA1C 6.3 (H) 03/01/2021   HGBA1C 6.3 (H) 02/24/2020     How often do you need to have someone help you when you read instructions, pamphlets, or other written materials from your doctor or pharmacy?: 1 - Never  Interpreter Needed?: No  Information entered by :: Dellie Fergusson, LPN   Activities of Daily Living    03/15/2024    2:43 PM  In your present state of health, do you have any difficulty performing the following activities:  Hearing? 0  Vision? 0  Difficulty concentrating or making decisions? 0  Walking or climbing stairs? 0  Dressing or bathing? 0  Doing errands,  shopping? 0  Preparing Food and eating ? N  Using the Toilet? N  In the past six months, have you accidently leaked urine? N  Do you have problems with loss of bowel control? N  Managing your Medications? N  Managing your Finances? N  Housekeeping or managing your Housekeeping? N    Patient Care Team: Raina Bunting, DO as PCP - General (Family Medicine)  Indicate any recent Medical Services you may have received from other than [facility] providers in the past year (date may be approximate).     Assessment:   This is a routine wellness examination for [facility].  Hearing/Vision screen Hearing Screening - Comments:: NO AIDS Vision Screening - Comments:: NO GLASSES-    Goals Addressed  This Visit's Progress    Cut out extra servings         Depression Screen     03/15/2024    2:40 PM 03/15/2024   10:07 AM 03/10/2023    3:30 PM 03/11/2022   10:43 AM 09/21/2021   10:25 AM 09/15/2020   11:38 AM 03/02/2020    9:02 AM  PHQ 2/9 Scores  PHQ - 2 Score 0 0 0 0 0 0 0  PHQ- 9 Score 0 0 0 0       Fall Risk     03/15/2024    2:43 PM 03/15/2024   10:07 AM 03/10/2023    3:32 PM 03/11/2022   10:43 AM 09/21/2021   10:24 AM  Fall Risk   Falls in the past year? 0 0 0 0 0  Number falls in past yr: 0 0 0 0   Injury with Fall? 0 0 0 0   Risk for fall due to : No Fall Risks No Fall Risks No Fall Risks No Fall Risks No Fall Risks  Follow up Falls prevention discussed;Falls evaluation completed Falls evaluation completed Falls prevention discussed;Falls evaluation completed Falls evaluation completed Falls evaluation completed;Education provided;Falls prevention discussed    MEDICARE RISK AT HOME:  Medicare Risk at Home Any stairs in or around the home?: Yes If so, are there any without handrails?: No Home free of loose throw rugs in walkways, pet beds, electrical cords, etc?: Yes Adequate lighting in your home to reduce risk of falls?: Yes Life alert?: No Use of a cane,  walker or w/c?: No Grab bars in the bathroom?: No Shower chair or bench in shower?: Yes Elevated toilet seat or a handicapped toilet?: Yes  TIMED UP AND GO:  Was the test performed?  No  Cognitive Function: 6CIT completed    08/23/2017   10:21 AM  MMSE - Mini Mental State Exam  Orientation to time 5  Orientation to Place 5  Registration 3  Attention/ Calculation 5  Recall 3  Language- name 2 objects 2  Language- repeat 1  Language- follow 3 step command 3  Language- read & follow direction 1  Write a sentence 1  Copy design 1  Total score 30        03/15/2024    2:45 PM 03/10/2023    3:40 PM 09/21/2021   10:27 AM 09/15/2020   11:40 AM 09/10/2019   11:53 AM  6CIT Screen  What Year? 0 points 0 points 0 points 0 points 0 points  What month? 0 points 0 points 0 points 0 points 0 points  What time? 0 points 0 points 0 points 0 points 0 points  Count back from 20 0 points 0 points 0 points 0 points 0 points  Months in reverse 0 points 0 points 0 points 0 points 0 points  Repeat phrase 0 points 0 points 2 points 0 points 0 points  Total Score 0 points 0 points 2 points 0 points 0 points    Immunizations Immunization History  Administered Date(s) Administered   Influenza, High Dose Seasonal PF 08/23/2017, 08/13/2019, 10/06/2023   Influenza-Unspecified 08/21/2018   Janssen (J&J) SARS-COV-2 Vaccination 04/09/2020   Pneumococcal Conjugate-13 09/04/2018   Pneumococcal Polysaccharide-23 08/13/2019    Screening Tests Health Maintenance  Topic Date Due   Hepatitis C Screening  Never done   DTaP/Tdap/Td (1 - Tdap) Never done   Zoster Vaccines- Shingrix  (1 of 2) Never done   Colonoscopy  Never done   COVID-19  Vaccine (2 - Janssen risk series) 05/07/2020   INFLUENZA VACCINE  06/21/2024   Medicare Annual Wellness (AWV)  03/15/2025   Pneumonia Vaccine 47+ Years old  Completed   HPV VACCINES  Aged Out   Meningococcal B Vaccine  Aged Out    Health Maintenance  Health  Maintenance Due  Topic Date Due   Hepatitis C Screening  Never done   DTaP/Tdap/Td (1 - Tdap) Never done   Zoster Vaccines- Shingrix  (1 of 2) Never done   Colonoscopy  Never done   COVID-19 Vaccine (2 - Janssen risk series) 05/07/2020   Health Maintenance Items Addressed: UP TO DATE ON SHOTS, DOESN'T WANT ANY MORE COVID SHOTS, DECLINES COLONOSCOPY  Additional Screening:  Vision Screening: Recommended annual ophthalmology exams for early detection of glaucoma and other disorders of the eye.  Dental Screening: Recommended annual dental exams for proper oral hygiene  Community Resource Referral / Chronic Care Management: CRR required this visit?  No   CCM required this visit?  No     Plan:     I have personally reviewed and noted the following in the patient's chart:   Medical and social history Use of alcohol, tobacco or illicit drugs  Current medications and supplements including opioid prescriptions. Patient is not currently taking opioid prescriptions. Functional ability and status Nutritional status Physical activity Advanced directives List of other physicians Hospitalizations, surgeries, and ER visits in previous 12 months Vitals Screenings to include cognitive, depression, and falls Referrals and appointments  In addition, I have reviewed and discussed with patient certain preventive protocols, quality metrics, and best practice recommendations. A written personalized care plan for preventive services as well as general preventive health recommendations were provided to patient.     Pinky Bright, LPN   1/61/0960   After Visit Summary: (MyChart) Due to this being a telephonic visit, the after visit summary with patients personalized plan was offered to patient via MyChart   Notes: Nothing significant to report at this time.

## 2024-03-15 NOTE — Patient Instructions (Addendum)
 Ricky Graves , Thank you for taking time to come for your Medicare Wellness Visit. I appreciate your ongoing commitment to your health goals. Please review the following plan we discussed and let me know if I can assist you in the future.   Referrals/Orders/Follow-Ups/Clinician Recommendations: NONE  This is a list of the screening recommended for you and due dates:  Health Maintenance  Topic Date Due   Hepatitis C Screening  Never done   DTaP/Tdap/Td vaccine (1 - Tdap) Never done   Zoster (Shingles) Vaccine (1 of 2) Never done   Colon Cancer Screening  Never done   COVID-19 Vaccine (2 - Janssen risk series) 05/07/2020   Flu Shot  06/21/2024   Medicare Annual Wellness Visit  03/15/2025   Pneumonia Vaccine  Completed   HPV Vaccine  Aged Out   Meningitis B Vaccine  Aged Out    Advanced directives: (ACP Link)Information on Advanced Care Planning can be found at [place]  Secretary of [facility] Advance Health Care Directives Advance Health Care Directives. [email or web address]   Next Medicare Annual Wellness Visit scheduled for next year: Yes   03/21/25 @ 2:00 PM BY PHONE

## 2024-05-21 ENCOUNTER — Ambulatory Visit
Admission: RE | Admit: 2024-05-21 | Discharge: 2024-05-21 | Disposition: A | Discharge status: Home / Self Care | Source: Ambulatory Visit | Attending: Internal Medicine | Admitting: Internal Medicine

## 2024-05-21 ENCOUNTER — Encounter: Payer: Self-pay | Admitting: Internal Medicine

## 2024-05-21 ENCOUNTER — Ambulatory Visit (INDEPENDENT_AMBULATORY_CARE_PROVIDER_SITE_OTHER): Admitting: Internal Medicine

## 2024-05-21 ENCOUNTER — Ambulatory Visit: Payer: Self-pay | Admitting: Internal Medicine

## 2024-05-21 VITALS — BP 124/70 | Ht 70.0 in | Wt 227.2 lb

## 2024-05-21 DIAGNOSIS — M7989 Other specified soft tissue disorders: Secondary | ICD-10-CM

## 2024-05-21 DIAGNOSIS — M79604 Pain in right leg: Secondary | ICD-10-CM | POA: Diagnosis not present

## 2024-05-21 DIAGNOSIS — I83811 Varicose veins of right lower extremities with pain: Secondary | ICD-10-CM | POA: Diagnosis not present

## 2024-05-21 DIAGNOSIS — I82811 Embolism and thrombosis of superficial veins of right lower extremities: Secondary | ICD-10-CM | POA: Diagnosis not present

## 2024-05-21 DIAGNOSIS — I824Z1 Acute embolism and thrombosis of unspecified deep veins of right distal lower extremity: Secondary | ICD-10-CM | POA: Diagnosis not present

## 2024-05-21 MED ORDER — APIXABAN 5 MG PO TABS: 5.0000 mg | ORAL_TABLET | Freq: Two times a day (BID) | ORAL | 1 refills | Status: AC

## 2024-05-21 NOTE — Patient Instructions (Signed)

## 2024-05-21 NOTE — Progress Notes (Signed)
 Subjective:    Patient ID: Ricky Graves, male    DOB: 08/06/1951, 73 y.o.   MRN: [id number]  HPI  Discussed the use of AI scribe software for clinical note transcription with the patient, who gave verbal consent to proceed.  Ricky Graves is a 73 year old male who presents with leg pain and swelling.  He has been experiencing right leg pain for a couple of weeks, which has worsened over the last four to five days. The pain initially started in one spot and has since moved up his leg. He describes the pain as feeling like 'fire' when he walks. He can feel a knot in the affected area but has not observed any redness, aside from sunburn.  He reports chronic swelling in his ankles which is not new or worsening.  No recent surgeries, long car rides, or flights. No chest pain or shortness of breath. He has no history of blood clots and quit smoking over fifteen years ago. He is concerned about the possibility of a blood clot, especially given his family history of strokes on both sides.       Review of Systems   Past Medical History:  Diagnosis Date   Adult acne    Cancer of the skin, basal cell     No current outpatient medications on file.   No current facility-administered medications for this visit.    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  Tobacco Use   Smoking status: Former    Current packs/day: 0.00     Average packs/day: 1.5 packs/day for 30.0 years (45.0 ttl pk-yrs)    Types: Cigarettes    Start date: 05/15/1978    Quit date: 05/15/2008    Years since quitting: 16.0   Smokeless tobacco: Never  Vaping Use   Vaping status: Never Used  Substance and Sexual Activity   Alcohol use: Yes    Comment: occasioanlly   Drug use: No   Sexual activity: Not Currently  Other Topics Concern   Not on file  Social History Narrative   Not on file   Social Drivers of Health   Financial Resource Strain: Low Risk  (03/15/2024)   Overall Financial Resource Strain (CARDIA)    Difficulty of Paying Living Expenses: Not hard at all  Food Insecurity: No Food Insecurity (03/15/2024)   Hunger Vital Sign    Worried About Running Out of Food in the Last Year: Never true    Ran Out of Food in the Last Year: Never true  Transportation Needs: No Transportation Needs (03/15/2024)   PRAPARE - Administrator, Civil Service (Medical): No    Lack of Transportation (Non-Medical): No  Physical Activity: Sufficiently Active (03/15/2024)   Exercise Vital Sign    Days of Exercise per Week: 3 days    Minutes  of Exercise per Session: 60 min  Stress: No Stress Concern Present (03/15/2024)   [employer] of Occupational Health - Occupational Stress Questionnaire    Feeling of Stress : Not at all  Social Connections: Moderately Isolated (03/15/2024)   Social Connection and Isolation Panel    Frequency of Communication with Friends and Family: More than three times a week    Frequency of Social Gatherings with Friends and Family: Never    Attends Religious Services: Never    Database administrator or Organizations: No    Attends Banker Meetings: Never    Marital Status: Married  Catering manager Violence: Not At Risk (03/15/2024)   Humiliation, Afraid, Rape, and Kick questionnaire    Fear of Current or Ex-Partner: No    Emotionally Abused: No    Physically Abused: No    Sexually Abused: No      Constitutional: Denies fever, malaise, fatigue, headache or abrupt weight changes.  Respiratory: Denies difficulty breathing, shortness of breath, cough or sputum production.   Cardiovascular: Patient reports swelling in ankles.  Denies chest pain, chest tightness, palpitations or swelling in the hands.  Musculoskeletal: Patient reports right leg pain.  Denies decrease in range of motion, difficulty with gait, muscle pain or joint pain.  Skin: Patient reports palpable knot of right lower extremity.  Denies redness, rashes, lesions or ulcercations.  Neurological: Denies numbness, tingling, weakness or problems with balance and coordination.    No other specific complaints in a complete review of systems (except as listed in HPI above).      Objective:   Physical Exam  BP 124/70 (BP Location: Left Arm, Patient Position: Sitting, Cuff Size: Normal)   Ht 5' 10 (1.778 m)   Wt 227 lb 3.2 oz (103.1 kg)   BMI 32.60 kg/m  Wt Readings from Last 3 Encounters:  05/21/24 227 lb 3.2 oz (103.1 kg)  03/15/24 230 lb 3.2 oz (104.4 kg)  03/10/23 218 lb (98.9 kg)    General: Appears his stated age, obese, in NAD. Skin: Warm, dry and intact. No redness or ulcerations noted. Cardiovascular: Normal rate and rhythm. S1,S2 noted.  No murmur, rubs or gallops noted.  Trace pitting edema noted around bilateral ankles.  Negative Homans on the right.  There is a palpable cordlike deformity noted of the right medial leg starting just below the knee extending up to the mid thigh. Pulmonary/Chest: Normal effort and positive vesicular breath sounds. No respiratory distress. No wheezes, rales or ronchi noted.  Musculoskeletal: Normal flexion and extension of the right knee.  No joint swelling noted.  No difficulty with gait.  Neurological: Alert and oriented.   BMET    Component Value Date/Time   NA 139 03/07/2022 0907   K 4.1 03/07/2022 0907   CL 105 03/07/2022 0907   CO2 25 03/07/2022 0907   GLUCOSE  113 (H) 03/07/2022 0907   BUN 13 03/07/2022 0907   CREATININE 1.15 03/07/2022 0907   CALCIUM 8.6 03/07/2022 0907   GFRNONAA 65 03/01/2021 0744   GFRAA 75 03/01/2021 0744    Lipid Panel     Component Value Date/Time   CHOL 204 (H) 03/07/2022 0907   TRIG 122 03/07/2022 0907   HDL 48 03/07/2022 0907   CHOLHDL 4.3 03/07/2022 0907   LDLCALC 133 (H) 03/07/2022 0907    CBC    Component Value Date/Time   WBC 6.5 03/07/2022 0907   RBC 4.86 03/07/2022 0907   HGB 14.8 03/07/2022 0907  HCT 43.2 03/07/2022 0907   PLT 209 03/07/2022 0907   MCV 88.9 03/07/2022 0907   MCH 30.5 03/07/2022 0907   MCHC 34.3 03/07/2022 0907   RDW 13.0 03/07/2022 0907   LYMPHSABS 1,755 03/07/2022 0907   EOSABS 117 03/07/2022 0907   BASOSABS 33 03/07/2022 0907    Hgb A1C Lab Results  Component Value Date   HGBA1C 6.3 (H) 03/07/2022            Assessment & Plan:   Assessment and Plan    Right leg pain and swelling. Suspected varicose veins due to pain and swelling pattern.  DDx includes superficial venous thrombus versus DVT.  Family history of strokes raises concern for DVT. - Order venous ultrasound to rule out DVT. - Refer to vascular specialist if varicose veins confirmed for potential laser therapy. - Advise wearing compression hose if varicose veins confirmed. - Encourage elevation to help reduce swelling    Schedule an appointment for your annual exam with your PCP Angeline Laura, NP

## 2024-05-21 NOTE — Addendum Note (Signed)
 Addended by: ANTONETTE ANGELINE ORN on: 05/21/2024 01:08 PM   Modules accepted: Orders

## 2024-05-22 ENCOUNTER — Other Ambulatory Visit

## 2024-06-11 ENCOUNTER — Encounter (INDEPENDENT_AMBULATORY_CARE_PROVIDER_SITE_OTHER): Payer: Self-pay | Admitting: Vascular Surgery

## 2024-06-11 ENCOUNTER — Ambulatory Visit (INDEPENDENT_AMBULATORY_CARE_PROVIDER_SITE_OTHER): Payer: Self-pay | Admitting: Vascular Surgery

## 2024-06-11 VITALS — BP 160/89 | HR 69 | Resp 16 | Ht 70.0 in | Wt 226.2 lb

## 2024-06-11 DIAGNOSIS — R7303 Prediabetes: Secondary | ICD-10-CM

## 2024-06-11 DIAGNOSIS — I809 Phlebitis and thrombophlebitis of unspecified site: Secondary | ICD-10-CM | POA: Insufficient documentation

## 2024-06-11 DIAGNOSIS — I8001 Phlebitis and thrombophlebitis of superficial vessels of right lower extremity: Secondary | ICD-10-CM

## 2024-06-11 NOTE — Progress Notes (Signed)
 Patient ID: Ricky Graves, male   DOB: September 25, 1951, 73 y.o.   MRN: [id number]  Chief Complaint  Patient presents with   Establish Care    New patient consult Venous embolism/thrombosis of superficial vessels of RLE. Ref. Baity    HPI Ricky Graves is a 73 y.o. male.  I am asked to see the patient by Angeline Laura for evaluation of superficial thrombophlebitis of the right great saphenous vein.  This started about 3 weeks ago.  It started down around his calf without a clear cause or inciting event.  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  That are significantly.  This then became red tender to the touch and began progressing up his leg into the distal thigh.  He was then appropriately started on Eliquis  by his primary care physician and the pain has significantly improved.  The redness and firmness has not progressed any further up the leg.  No chest pain or shortness of breath.  A duplex study 3 weeks ago confirms superficial thrombophlebitis of the right great saphenous vein.   Past Medical History:  Diagnosis Date   Adult acne    Cancer of the skin, basal cell     Past Surgical History:  Procedure Laterality Date   TONSILLECTOMY     VASECTOMY  1980     Family History  Problem Relation Age of Onset   Heart disease Father    Stroke Father    Diabetes Father    Diabetes Paternal Grandmother       Social History   Tobacco Use   Smoking status: Former    Current packs/day: 0.00    Average packs/day: 1.5 packs/day for 30.0 years (45.0 ttl pk-yrs)    Types: Cigarettes    Start date: 05/15/1978    Quit date: 05/15/2008    Years since quitting: 16.0   Smokeless tobacco: Never  Vaping Use   Vaping status: Never Used  Substance Use Topics   Alcohol use: Yes    Comment: occasioanlly   Drug use: No     Allergies  Allergen Reactions   Benadryl [Diphenhydramine] Other (See Comments)    (ORAL) makes me jumpy  Can use topical cream     Current Outpatient Medications  Medication Sig Dispense Refill   apixaban  (ELIQUIS ) 5 MG TABS tablet Take 1 tablet (5 mg total) by mouth 2 (two) times daily. 60 tablet 1   No current facility-administered medications for this visit.      REVIEW OF SYSTEMS (Negative unless checked)  Constitutional: [] Weight loss  [] Fever  [] Chills Cardiac: [] Chest pain   [] Chest pressure   [] Palpitations   [] Shortness of breath when laying flat   [] Shortness of breath at rest   [] Shortness of breath with exertion. Vascular:  [] Pain in legs with walking   [] Pain in legs at rest   [] Pain in legs when laying flat   [] Claudication   [] Pain in feet when walking  [] Pain in feet at rest  [] Pain in feet when laying flat   [x] History of DVT   [x] Phlebitis   [] Swelling in legs   [] Varicose veins   [] Non-healing ulcers Pulmonary:   [] Uses home oxygen   [] Productive cough   [] Hemoptysis   [] Wheeze  [] COPD   [] Asthma Neurologic:  [] Dizziness  [] Blackouts   [] Seizures   [] History of stroke   [] History of TIA  [] Aphasia   [] Temporary blindness   [] Dysphagia   [] Weakness or  numbness in arms   [] Weakness or numbness in legs Musculoskeletal:  [] Arthritis   [] Joint swelling   [] Joint pain   [] Low back pain Hematologic:  [] Easy bruising  [] Easy bleeding   [] Hypercoagulable state   [] Anemic  [] Hepatitis Gastrointestinal:  [] Blood in stool   [] Vomiting blood  [] Gastroesophageal reflux/heartburn   [] Abdominal pain Genitourinary:  [] Chronic kidney disease   [] Difficult urination  [] Frequent urination  [] Burning with urination   [] Hematuria Skin:  [] Rashes   [] Ulcers   [] Wounds Psychological:  [] History of anxiety   []  History of major depression.    Physical Exam BP (!) 160/89 (BP Location: Left Arm, Patient Position: Sitting, Cuff Size: Large)   Pulse 69   Resp 16   Ht 5' 10 (1.778 m)   Wt 226 lb 3.2 oz (102.6 kg)   BMI 32.46 kg/m  Gen:  WD/WN, NAD. Appears younger than stated age. Head: [place]/AT, No temporalis  wasting.  Ear/Nose/Throat: Hearing grossly intact, nares w/o erythema or drainage, oropharynx w/o Erythema/Exudate Eyes: Conjunctiva clear, sclera non-icteric  Neck: trachea midline.  No JVD.  Pulmonary:  Good air movement, respirations not labored, no use of accessory muscles  Cardiac: RRR, no JVD Vascular: Firm, indurated area on the right calf and lower thigh area consistent with a superficial thrombophlebitis. Vessel Right Left  Radial Palpable Palpable                                   Gastrointestinal:. No masses, surgical incisions, or scars. Musculoskeletal: M/S 5/5 throughout.  Extremities without ischemic changes.  No deformity or atrophy.  Trace right lower extremity edema. Neurologic: Sensation grossly intact in extremities.  Symmetrical.  Speech is fluent. Motor exam as listed above. Psychiatric: Judgment intact, Mood & affect appropriate for pt's clinical situation. Dermatologic: No rashes or ulcers noted.  No cellulitis or open wounds.    Radiology US  Venous Img Lower Unilateral Right (DVT) Result Date: 05/21/2024 CLINICAL DATA:  Right lower extremity pain and swelling. EXAM: RIGHT LOWER EXTREMITY VENOUS DOPPLER ULTRASOUND TECHNIQUE: Gray-scale sonography with graded compression, as well as color Doppler and duplex ultrasound were performed to evaluate the lower extremity deep venous systems from the level of the common femoral vein and including the common femoral, femoral, profunda femoral, popliteal and calf veins including the posterior tibial, peroneal and gastrocnemius veins when visible. The superficial great saphenous vein was also interrogated. Spectral Doppler was utilized to evaluate flow at rest and with distal augmentation maneuvers in the common femoral, femoral and popliteal veins. COMPARISON:  None Available. FINDINGS: Contralateral Common Femoral Vein: Respiratory phasicity is normal and symmetric with the symptomatic side. No evidence of thrombus. Normal  compressibility. Common Femoral Vein: No evidence of thrombus. Normal compressibility, respiratory phasicity and response to augmentation. Saphenofemoral Junction: No evidence of thrombus. Normal compressibility and flow on color Doppler imaging. Profunda Femoral Vein: No evidence of thrombus. Normal compressibility and flow on color Doppler imaging. Femoral Vein: No evidence of thrombus. Normal compressibility, respiratory phasicity and response to augmentation. Popliteal Vein: No evidence of thrombus. Normal compressibility, respiratory phasicity and response to augmentation. Calf Veins: No evidence of thrombus. Normal compressibility and flow on color Doppler imaging. Superficial Great Saphenous Vein: Nonocclusive thrombus throughout the greater saphenous vein extends from the thigh into the calf. IMPRESSION: 1. No evidence of right lower extremity DVT. 2. Superficial thrombus with nonocclusive clot in the greater saphenous vein within the thigh and calf. Electronically  Signed   By: Andrea Gasman M.D.   On: 05/21/2024 15:46    Labs No results found for this or any previous visit (from the past 2160 hours).  Assessment/Plan:  Superficial thrombophlebitis Patient has superficial thrombophlebitis of the right great saphenous vein.  He has had appropriate improvement with addition of Eliquis .  He will continue this for approximately 6 weeks total.  I will plan a duplex and follow-up in 3 to 4 weeks to evaluate him for cessation of anticoagulation and whether or not he has residual venous reflux.  He should elevate his legs and use anti-inflammatories and warm compresses as needed for comfort.  Pre-diabetes blood glucose control important in reducing the progression of atherosclerotic disease. Also, involved in wound healing. Not on medications.       Selinda Gu 06/11/2024, 10:08 AM   This note was created with Dragon medical transcription system.  Any errors from dictation are unintentional.

## 2024-06-11 NOTE — Assessment & Plan Note (Signed)
blood glucose control important in reducing the progression of atherosclerotic disease. Also, involved in wound healing. Not on medications.

## 2024-06-11 NOTE — Assessment & Plan Note (Signed)
 Patient has superficial thrombophlebitis of the right great saphenous vein.  He has had appropriate improvement with addition of Eliquis .  He will continue this for approximately 6 weeks total.  I will plan a duplex and follow-up in 3 to 4 weeks to evaluate him for cessation of anticoagulation and whether or not he has residual venous reflux.  He should elevate his legs and use anti-inflammatories and warm compresses as needed for comfort.

## 2024-06-13 ENCOUNTER — Ambulatory Visit (INDEPENDENT_AMBULATORY_CARE_PROVIDER_SITE_OTHER): Admitting: Family Medicine

## 2024-06-13 ENCOUNTER — Encounter: Payer: Self-pay | Admitting: Family Medicine

## 2024-06-13 VITALS — BP 132/84 | HR 63 | Ht 70.0 in | Wt 226.0 lb

## 2024-06-13 DIAGNOSIS — I8001 Phlebitis and thrombophlebitis of superficial vessels of right lower extremity: Secondary | ICD-10-CM | POA: Diagnosis not present

## 2024-06-13 DIAGNOSIS — E782 Mixed hyperlipidemia: Secondary | ICD-10-CM | POA: Diagnosis not present

## 2024-06-13 DIAGNOSIS — Z0000 Encounter for general adult medical examination without abnormal findings: Secondary | ICD-10-CM

## 2024-06-13 DIAGNOSIS — E66811 Obesity, class 1: Secondary | ICD-10-CM | POA: Diagnosis not present

## 2024-06-13 DIAGNOSIS — R7303 Prediabetes: Secondary | ICD-10-CM

## 2024-06-13 NOTE — Patient Instructions (Addendum)
 Thank you for coming to the office today.  Prevnar-20 pneumonia vaccine update at pharmacy if interested Your last vaccine for pneumonia was 2020, so now due after 5 years  Shingles vaccine, Shingrix  2 doses 2-6 month apart at pharmacy  Recommend repeat labs for Annual  A1c sugar screening, - Pre-Diabetes testing Chemistry panel Lipid cholesterol PSA Prostate  Coronary Calcium Score Cardiac CT Scan. This is a screening test for patients aged 72-50+ with cardiovascular risk factors or who are healthy but would be interested in Cardiovascular Screening for heart disease. Even if there is a family history of heart disease, this imaging can be useful. Typically it can be done every 5+ years or at a different timeline we agree on  The scan will look at the chest and mainly focus on the heart and identify early signs of calcium build up or blockages within the heart arteries. It is not 100% accurate for identifying blockages or heart disease, but it is useful to help us  predict who may have some early changes or be at risk in the future for a heart attack or cardiovascular problem.  The results are reviewed by a Cardiologist and they will document the results. It should become available on MyChart. Typically the results are divided into percentiles based on other patients of the same demographic and age. So it will compare your risk to others similar to you. If you have a higher score >99 or higher percentile >75%tile, it is recommended to consider Statin cholesterol therapy and or referral to Cardiologist. I will try to help explain your results and if we have questions we can contact the Cardiologist.  You will be contacted for scheduling. Usually it is done at any imaging facility through [facility], [facility] or [facility].  The cost is $99 flat fee total and it does not go through insurance, so no authorization is required.    Please schedule a Follow-up  Appointment to: Return for 1 year Annual Physical AM apt.  If you have any other questions or concerns, please feel free to call the office or send a message through MyChart. You may also schedule an earlier appointment if necessary.  Additionally, you may be receiving a survey about your experience at our office within a few days to 1 week by e-mail or mail. We value your feedback.  Marsa Officer, DO [facility], [place]

## 2024-06-13 NOTE — Progress Notes (Signed)
 Subjective:    Patient ID: Ricky Graves, male    DOB: [birth year], 73 y.o.   MRN: [id number]  Ricky Graves is a 72 y.o. male presenting on 06/13/2024 for Annual Exam   HPI  Discussed the use of AI scribe software for clinical note transcription with the patient, who gave verbal consent to proceed.  History of Present Illness   Ricky Graves is a 73 year old male who presents for an annual physical exam.   Knee pain and mobility limitation - History of left knee issues - Reduced running and ambulatory activities due to uncertainty about knee and vein stability  Weight fluctuation and smoking cessation - Weight fluctuates between 202 and 240 pounds, depending on activity level and time of year - Weight gain attributed to smoking cessation  Pre-Diabetes Prior trend A1c 6.3 (2023). Similar to previous results. He declined recent repeat CBGs: Not checking CBG Meds: Never on meds Currently not on ACEi / ARB Lifestyle: Weight stable from last. - Diet: Improved. drinking more water. Denies hypoglycemia, numbness tingling weakness   Elevated BP without diagnosis of HTN Not checking BP outside office. History of white coat syndrome. Usually BP improves on re-check. Current Meds - None (never on) Denies CP, dyspnea, HA, edema, dizziness / lightheadedness   HYPERLIPIDEMIA: - Reports no concerns. Last lipid panel 02/2022, mixed results with elevated LDL 133 - Never on cholesterol medicine, he is still not interested in Statin. He prefers to improve lifestyle   PMH - History of Environmental / Seasonal Allergies, History of Basal Cell Carcinoma   Right Superficial Thrombophlebitis Recent issue with this swelling and pain, he was treated and had US  Duplex, and referred to Vascular, placed on anticoagulation with Eliquis  for 6 weeks, has several weeks to go then return for US  Duplex   Health Maintenance: - Colon Cancer Screening: Never had colonoscopy. He has done  FOBT negative (01/2017). No known family history of colon cancer. DUE for colon CA screening. Offered again with options FOBT, Cologuard, Colonoscopy - emphasized importance of screening, he is not worried because he has no family history. Encouraged him to reconsider.   - Prostate Cancer Screening: Last PSA 0.84 (02/2022).  Past DRE reportedly normal. No family or personal history of prostate cancer. He is asymptomatic without LUTS.      06/13/2024    9:39 AM 05/21/2024   11:13 AM 03/15/2024    2:40 PM  Depression screen PHQ 2/9  Decreased Interest 0 0 0  Down, Depressed, Hopeless 0 0 0  PHQ - 2 Score 0 0 0  Altered sleeping 0 0 0  Tired, decreased energy 0 0 0  Change in appetite 0 0 0  Feeling bad or failure about yourself  0 0 0  Trouble concentrating 0 0 0  Moving slowly or fidgety/restless 0 0 0  Suicidal thoughts 0 0 0  PHQ-9 Score 0 0 0  Difficult doing work/chores  Not difficult at all Not difficult at all       06/13/2024    9:39 AM 05/21/2024   11:13 AM 03/15/2024   10:07 AM 03/11/2022   10:43 AM  GAD 7 : Generalized Anxiety Score  Nervous, Anxious, on Edge 0 0 0 0  Control/stop worrying 0 0 0 0  Worry too much - different things 0 0 0 0  Trouble relaxing 0 0 0 0  Restless 0 0 0 0  Easily annoyed or irritable 0 0 0 0  Afraid - awful might happen 0 0 0 0  Total GAD 7 Score 0 0 0 0  Anxiety Difficulty  Not difficult at all Not difficult at all Not difficult at all     Past Medical History:  Diagnosis Date   Adult acne    Cancer of the skin, basal cell    Past Surgical History:  Procedure Laterality Date   TONSILLECTOMY     VASECTOMY  1980   Social History   Socioeconomic History   Marital status: Married    Spouse name: Not on file   Number of children: Not on file   Years of education: Vocational   Highest education level: Some college, no degree  Occupational History   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  Tobacco  Use   Smoking status: Former    Current packs/day: 0.00    Average packs/day: 1.5 packs/day for 30.0 years (45.0 ttl pk-yrs)    Types: Cigarettes    Start date: 05/15/1978    Quit date: 05/15/2008    Years since quitting: 16.0   Smokeless tobacco: Never  Vaping Use   Vaping status: Never Used  Substance and Sexual Activity   Alcohol use: Yes    Comment: occasioanlly   Drug use: No   Sexual activity: Not Currently  Other Topics Concern   Not on file  Social History Narrative   Not on file   Social Drivers of Health   Financial Resource Strain: Low Risk  (03/15/2024)   Overall Financial Resource Strain (CARDIA)    Difficulty of Paying Living Expenses: Not hard at all  Food Insecurity: No Food Insecurity (03/15/2024)   Hunger Vital Sign    Worried About Running Out of Food in the Last Year: Never true    Ran Out of Food in the Last Year: Never true  Transportation Needs: No Transportation Needs (03/15/2024)   PRAPARE - Administrator, Civil Service (Medical): No    Lack of Transportation (Non-Medical): No  Physical Activity: Sufficiently Active (03/15/2024)   Exercise Vital Sign    Days of Exercise per Week: 3 days    Minutes of Exercise per Session: 60 min  Stress: No Stress Concern Present (03/15/2024)   [employer] of Occupational Health - Occupational Stress Questionnaire    Feeling of Stress : Not at all  Social Connections: Moderately Isolated (03/15/2024)   Social Connection and Isolation Panel    Frequency of Communication with Friends and Family: More than three times a week    Frequency of Social Gatherings with Friends and Family: Never    Attends Religious Services: Never    Database administrator or Organizations: No    Attends Banker Meetings: Never    Marital Status: Married  Catering manager Violence: Not At Risk (03/15/2024)   Humiliation, Afraid, Rape, and Kick questionnaire    Fear of Current or Ex-Partner: No    Emotionally  Abused: No    Physically Abused: No    Sexually Abused: No   Family History  Problem Relation Age of Onset   Heart disease Father    Stroke Father    Diabetes Father    Diabetes Paternal Grandmother    Current Outpatient Medications on File Prior to Visit  Medication Sig   apixaban  (ELIQUIS ) 5 MG TABS tablet Take 1 tablet (5 mg total) by mouth 2 (two) times daily.   No current facility-administered medications on file prior to  visit.    Review of Systems  Constitutional:  Negative for activity change, appetite change, chills, diaphoresis, fatigue and fever.  HENT:  Negative for congestion and hearing loss.   Eyes:  Negative for visual disturbance.  Respiratory:  Negative for cough, chest tightness, shortness of breath and wheezing.   Cardiovascular:  Negative for chest pain, palpitations and leg swelling.  Gastrointestinal:  Negative for abdominal pain, constipation, diarrhea, nausea and vomiting.  Genitourinary:  Negative for dysuria, frequency and hematuria.  Musculoskeletal:  Positive for arthralgias. Negative for neck pain.  Skin:  Negative for rash.  Neurological:  Negative for dizziness, weakness, light-headedness, numbness and headaches.  Hematological:  Negative for adenopathy.  Psychiatric/Behavioral:  Negative for behavioral problems, dysphoric mood and sleep disturbance.    Per HPI unless specifically indicated above     Objective:    BP 132/84 (BP Location: Right Arm, Patient Position: Sitting, Cuff Size: Normal)   Pulse 63   Ht 5' 10 (1.778 m)   Wt 226 lb (102.5 kg)   SpO2 97%   BMI 32.43 kg/m   Wt Readings from Last 3 Encounters:  06/13/24 226 lb (102.5 kg)  06/11/24 226 lb 3.2 oz (102.6 kg)  05/21/24 227 lb 3.2 oz (103.1 kg)    Physical Exam Vitals and nursing note reviewed.  Constitutional:      General: He is not in acute distress.    Appearance: He is well-developed. He is not diaphoretic.     Comments: Well-appearing, comfortable, cooperative   HENT:     Head: Normocephalic and atraumatic.  Eyes:     General:        Right eye: No discharge.        Left eye: No discharge.     Conjunctiva/sclera: Conjunctivae normal.     Pupils: Pupils are equal, round, and reactive to light.  Neck:     Thyroid: No thyromegaly.     Vascular: No carotid bruit.  Cardiovascular:     Rate and Rhythm: Normal rate and regular rhythm.     Pulses: Normal pulses.     Heart sounds: Normal heart sounds. No murmur heard. Pulmonary:     Effort: Pulmonary effort is normal. No respiratory distress.     Breath sounds: Normal breath sounds. No wheezing or rales.  Abdominal:     General: Bowel sounds are normal. There is no distension.     Palpations: Abdomen is soft. There is no mass.     Tenderness: There is no abdominal tenderness.  Musculoskeletal:        General: No tenderness. Normal range of motion.     Cervical back: Normal range of motion and neck supple.     Right lower leg: No edema.     Left lower leg: No edema.     Comments: Upper / Lower Extremities: - Normal muscle tone, strength bilateral upper extremities 5/5, lower extremities 5/5  Lymphadenopathy:     Cervical: No cervical adenopathy.  Skin:    General: Skin is warm and dry.     Findings: No erythema or rash.  Neurological:     Mental Status: He is alert and oriented to person, place, and time.     Comments: Distal sensation intact to light touch all extremities  Psychiatric:        Mood and Affect: Mood normal.        Behavior: Behavior normal.        Thought Content: Thought content normal.  Comments: Well groomed, good eye contact, normal speech and thoughts     Results for orders placed or performed in visit on 03/07/22  PSA   Collection Time: 03/07/22  9:07 AM  Result Value Ref Range   PSA 0.84 < OR = 4.00 ng/mL  Lipid panel   Collection Time: 03/07/22  9:07 AM  Result Value Ref Range   Cholesterol 204 (H) <200 mg/dL   HDL 48 > OR = 40 mg/dL   Triglycerides  877 <849 mg/dL   LDL Cholesterol (Calc) 133 (H) mg/dL (calc)   Total CHOL/HDL Ratio 4.3 <5.0 (calc)   Non-HDL Cholesterol (Calc) 156 (H) <130 mg/dL (calc)  COMPLETE METABOLIC PANEL WITH GFR   Collection Time: 03/07/22  9:07 AM  Result Value Ref Range   Glucose, Bld 113 (H) 65 - 99 mg/dL   BUN 13 7 - 25 mg/dL   Creat 8.84 9.29 - 8.64 mg/dL   eGFR 69 > OR = 60 fO/fpw/8.26f7   BUN/Creatinine Ratio NOT APPLICABLE 6 - 22 (calc)   Sodium 139 135 - 146 mmol/L   Potassium 4.1 3.5 - 5.3 mmol/L   Chloride 105 98 - 110 mmol/L   CO2 25 20 - 32 mmol/L   Calcium 8.6 8.6 - 10.3 mg/dL   Total Protein 6.2 6.1 - 8.1 g/dL   Albumin 3.6 3.6 - 5.1 g/dL   Globulin 2.6 1.9 - 3.7 g/dL (calc)   AG Ratio 1.4 1.0 - 2.5 (calc)   Total Bilirubin 0.5 0.2 - 1.2 mg/dL   Alkaline phosphatase (APISO) 80 35 - 144 U/L   AST 10 10 - 35 U/L   ALT 11 9 - 46 U/L  CBC with Differential/Platelet   Collection Time: 03/07/22  9:07 AM  Result Value Ref Range   WBC 6.5 3.8 - 10.8 Thousand/uL   RBC 4.86 4.20 - 5.80 Million/uL   Hemoglobin 14.8 13.2 - 17.1 g/dL   HCT 56.7 61.4 - 49.9 %   MCV 88.9 80.0 - 100.0 fL   MCH 30.5 27.0 - 33.0 pg   MCHC 34.3 32.0 - 36.0 g/dL   RDW 86.9 88.9 - 84.9 %   Platelets 209 140 - 400 Thousand/uL   MPV 10.5 7.5 - 12.5 fL   Neutro Abs 4,128 1,500 - 7,800 cells/uL   Lymphs Abs 1,755 850 - 3,900 cells/uL   Absolute Monocytes 468 200 - 950 cells/uL   Eosinophils Absolute 117 15 - 500 cells/uL   Basophils Absolute 33 0 - 200 cells/uL   Neutrophils Relative % 63.5 %   Total Lymphocyte 27.0 %   Monocytes Relative 7.2 %   Eosinophils Relative 1.8 %   Basophils Relative 0.5 %  Hemoglobin A1c   Collection Time: 03/07/22  9:07 AM  Result Value Ref Range   Hgb A1c MFr Bld 6.3 (H) <5.7 % of total Hgb   Mean Plasma Glucose 134 mg/dL   eAG (mmol/L) 7.4 mmol/L      Assessment & Plan:   Problem List Items Addressed This Visit     Hyperlipidemia   Obesity (BMI 30.0-34.9)   Pre-diabetes    Superficial thrombophlebitis   Other Visit Diagnoses       Annual physical exam    -  Primary        Updated Health Maintenance information Offer fasting labs. He declined at this time. Last done 2023. Encouraged improvement to lifestyle with diet and exercise Goal of weight loss  Follow up Superficial Thrombophlebitis Recently diagnosed, referred to  Vascular. Superficial thrombophlebitis on the right side, treated with Eliquis . Improvement noted with pain resolution. Follow-up ultrasound scheduled. - Continue Eliquis  for six weeks. - Schedule follow-up ultrasound on August 26th.  Left Knee Pain Left knee pain managed with Tylenol. Aware of NSAID risks with Eliquis . Topical NSAIDs can be used sparingly if pain increases. - Use Tylenol as needed. - Consider topical NSAIDs sparingly if pain increases.  General Health Maintenance Preventive health measures discussed. Declined colon cancer screening. Considering pneumonia and shingles vaccinations. Blood work discussed for prediabetes and prostate health. Vaccines available at pharmacy free of charge. Declined heart CT scan. - Consider Prevnar 20 pneumonia vaccine at pharmacy. - Consider Shingrix  shingles vaccine, two doses, at pharmacy. - Recommend repeat labs for annuals, including A1c and prostate check.  Follow-up Prefers annual visits unless new issues arise. - Schedule follow-up in one year. - Return if new issues arise.        No orders of the defined types were placed in this encounter.   No orders of the defined types were placed in this encounter.    Follow up plan: Return for 1 year Annual Physical AM apt.  Marsa Officer, DO [facility] 06/13/2024, 9:59 AM

## 2024-07-16 ENCOUNTER — Encounter (INDEPENDENT_AMBULATORY_CARE_PROVIDER_SITE_OTHER): Payer: Self-pay | Admitting: Vascular Surgery

## 2024-07-16 ENCOUNTER — Ambulatory Visit (INDEPENDENT_AMBULATORY_CARE_PROVIDER_SITE_OTHER): Admitting: Vascular Surgery

## 2024-07-16 ENCOUNTER — Ambulatory Visit (INDEPENDENT_AMBULATORY_CARE_PROVIDER_SITE_OTHER)

## 2024-07-16 VITALS — BP 131/84 | HR 64 | Ht 70.0 in | Wt 227.2 lb

## 2024-07-16 DIAGNOSIS — R7303 Prediabetes: Secondary | ICD-10-CM | POA: Diagnosis not present

## 2024-07-16 DIAGNOSIS — I8001 Phlebitis and thrombophlebitis of superficial vessels of right lower extremity: Secondary | ICD-10-CM

## 2024-07-16 NOTE — Progress Notes (Signed)
 MRN : [id number]  Ricky Graves is a 73 y.o. (07-18-1951) male who presents with chief complaint of  Chief Complaint  Patient presents with   Follow-up  .  History of Present Illness: Patient returns today in follow up of his right leg SVT and venous insufficiency. He is doing well.  His pain and swelling are markedly improved.  He has completed approximately 6-week course of Eliquis  at this point.  He ran out of this on Sunday.  No problems with bleeding on the Eliquis .  A follow-up venous reflux study was performed today which shows some thickening of the saphenous vein consistent with the previous superficial thrombophlebitis with no active clot.  The right great saphenous vein does demonstrate significant reflux throughout.  Current Outpatient Medications  Medication Sig Dispense Refill   apixaban  (ELIQUIS ) 5 MG TABS tablet Take 1 tablet (5 mg total) by mouth 2 (two) times daily. (Patient not taking: Reported on 07/16/2024) 60 tablet 1   No current facility-administered medications for this visit.    Past Medical History:  Diagnosis Date   Adult acne    Cancer of the skin, basal cell     Past Surgical History:  Procedure Laterality Date   TONSILLECTOMY     VASECTOMY  1980     Social History   Tobacco Use   Smoking status: Former    Current packs/day: 0.00    Average packs/day: 1.5 packs/day for 30.0 years (45.0 ttl pk-yrs)    Types: Cigarettes    Start date: 05/15/1978    Quit date: 05/15/2008    Years since quitting: 16.1   Smokeless tobacco: Never  Vaping Use   Vaping status: Never Used  Substance Use Topics   Alcohol use: Yes    Comment: occasioanlly   Drug use: No      Family History  Problem Relation Age of Onset   Heart disease Father    Stroke Father    Diabetes Father    Diabetes Paternal Grandmother      Allergies  Allergen Reactions   Benadryl [Diphenhydramine] Other (See Comments)    (ORAL) makes me jumpy  Can use topical cream      REVIEW OF SYSTEMS (Negative unless checked)  Constitutional: [] Weight loss  [] Fever  [] Chills Cardiac: [] Chest pain   [] Chest pressure   [] Palpitations   [] Shortness of breath when laying flat   [] Shortness of breath at rest   [] Shortness of breath with exertion. Vascular:  [] Pain in legs with walking   [] Pain in legs at rest   [] Pain in legs when laying flat   [] Claudication   [] Pain in feet when walking  [] Pain in feet at rest  [] Pain in feet when laying flat   [x] History of DVT   [x] Phlebitis   [] Swelling in legs   [] Varicose veins   [] Non-healing ulcers Pulmonary:   [] Uses home oxygen   [] Productive cough   [] Hemoptysis   [] Wheeze  [] COPD   [] Asthma Neurologic:  [] Dizziness  [] Blackouts   [] Seizures   [] History of stroke   [] History of TIA  [] Aphasia   [] Temporary blindness   [] Dysphagia   [] Weakness or numbness in arms   [] Weakness or numbness in legs Musculoskeletal:  [] Arthritis   [] Joint swelling   [] Joint pain   [] Low back pain Hematologic:  [] Easy bruising  [] Easy bleeding   [] Hypercoagulable state   [] Anemic   Gastrointestinal:  [] Blood in stool   [] Vomiting blood  [] Gastroesophageal reflux/heartburn   [] Abdominal  pain Genitourinary:  [] Chronic kidney disease   [] Difficult urination  [] Frequent urination  [] Burning with urination   [] Hematuria Skin:  [] Rashes   [] Ulcers   [] Wounds Psychological:  [] History of anxiety   []  History of major depression.  Physical Examination  BP 131/84   Pulse 64   Ht 5' 10 (1.778 m)   Wt 227 lb 4 oz (103.1 kg)   BMI 32.61 kg/m  Gen:  WD/WN, NAD Head: [place]/AT, No temporalis wasting. Ear/Nose/Throat: Hearing grossly intact, nares w/o erythema or drainage Eyes: Conjunctiva clear. Sclera non-icteric Neck: Supple.  Trachea midline Pulmonary:  Good air movement, no use of accessory muscles.  Cardiac: RRR, no JVD Vascular:  Vessel Right Left  Radial Palpable Palpable                   Musculoskeletal: M/S 5/5 throughout.  No deformity or  atrophy. Trace BLE edema. Neurologic: Sensation grossly intact in extremities.  Symmetrical.  Speech is fluent.  Psychiatric: Judgment intact, Mood & affect appropriate for pt's clinical situation. Dermatologic: No rashes or ulcers noted.  No cellulitis or open wounds.      Labs No results found for this or any previous visit (from the past 2160 hours).  Radiology No results found.  Assessment/Plan  Pre-diabetes blood glucose control important in reducing the progression of atherosclerotic disease. Also, involved in wound healing. Not on meds   Superficial thrombophlebitis  A follow-up venous reflux study was performed today which shows some thickening of the saphenous vein consistent with the previous superficial thrombophlebitis with no active clot.  The right great saphenous vein does demonstrate significant reflux throughout. We had a discussion today regarding the situation.  He can stop his Eliquis  at this point.  It would certainly be reasonable to consider venous ablation for the great saphenous vein reflux to avoid pain, swelling, and reduce the risk of future thrombotic episodes.  He is currently not that symptomatic and not interested in doing that and that certainly reasonable as well.  He can wear compression socks to help keep his swelling under control, elevate his legs, and remain active.  I will plan to see him back as needed if his symptoms worsen.    Selinda Gu, MD  07/16/2024 12:07 PM    This note was created with Dragon medical transcription system.  Any errors from dictation are purely unintentional

## 2024-07-16 NOTE — Assessment & Plan Note (Signed)
blood glucose control important in reducing the progression of atherosclerotic disease. Also, involved in wound healing. Not on meds

## 2024-07-16 NOTE — Assessment & Plan Note (Signed)
 A follow-up venous reflux study was performed today which shows some thickening of the saphenous vein consistent with the previous superficial thrombophlebitis with no active clot.  The right great saphenous vein does demonstrate significant reflux throughout. We had a discussion today regarding the situation.  He can stop his Eliquis  at this point.  It would certainly be reasonable to consider venous ablation for the great saphenous vein reflux to avoid pain, swelling, and reduce the risk of future thrombotic episodes.  He is currently not that symptomatic and not interested in doing that and that certainly reasonable as well.  He can wear compression socks to help keep his swelling under control, elevate his legs, and remain active.  I will plan to see him back as needed if his symptoms worsen.

## 2024-07-17 ENCOUNTER — Encounter: Admitting: Family Medicine

## 2025-03-21 ENCOUNTER — Ambulatory Visit

## 2025-03-26 ENCOUNTER — Ambulatory Visit
